# Patient Record
Sex: Female | Born: 1935
Health system: Southern US, Community
[De-identification: ages and names within clinical notes are randomized; demographics above are authoritative.]

## PROBLEM LIST (undated history)

## (undated) DIAGNOSIS — M810 Age-related osteoporosis without current pathological fracture: Secondary | ICD-10-CM

## (undated) DIAGNOSIS — R7303 Prediabetes: Secondary | ICD-10-CM

## (undated) DIAGNOSIS — H269 Unspecified cataract: Secondary | ICD-10-CM

## (undated) DIAGNOSIS — L815 Leukoderma, not elsewhere classified: Secondary | ICD-10-CM

## (undated) DIAGNOSIS — M75100 Unspecified rotator cuff tear or rupture of unspecified shoulder, not specified as traumatic: Secondary | ICD-10-CM

## (undated) DIAGNOSIS — B351 Tinea unguium: Secondary | ICD-10-CM

## (undated) DIAGNOSIS — F4321 Adjustment disorder with depressed mood: Secondary | ICD-10-CM

## (undated) DIAGNOSIS — H35039 Hypertensive retinopathy, unspecified eye: Secondary | ICD-10-CM

## (undated) DIAGNOSIS — K219 Gastro-esophageal reflux disease without esophagitis: Secondary | ICD-10-CM

## (undated) DIAGNOSIS — I10 Essential (primary) hypertension: Secondary | ICD-10-CM

## (undated) DIAGNOSIS — H3530 Unspecified macular degeneration: Secondary | ICD-10-CM

## (undated) DIAGNOSIS — N838 Other noninflammatory disorders of ovary, fallopian tube and broad ligament: Secondary | ICD-10-CM

## (undated) DIAGNOSIS — D569 Thalassemia, unspecified: Secondary | ICD-10-CM

## (undated) DIAGNOSIS — F4320 Adjustment disorder, unspecified: Secondary | ICD-10-CM

## (undated) HISTORY — DX: Unspecified cataract: H26.9

## (undated) HISTORY — PX: EYE SURGERY: SHX253

## (undated) HISTORY — DX: Gastro-esophageal reflux disease without esophagitis: K21.9

## (undated) HISTORY — DX: Leukoderma, not elsewhere classified: L81.5

## (undated) HISTORY — DX: Age-related osteoporosis without current pathological fracture: M81.0

## (undated) HISTORY — DX: Adjustment disorder, unspecified: F43.20

## (undated) HISTORY — DX: Unspecified rotator cuff tear or rupture of unspecified shoulder, not specified as traumatic: M75.100

## (undated) HISTORY — DX: Hypertensive retinopathy, unspecified eye: H35.039

## (undated) HISTORY — DX: Tinea unguium: B35.1

## (undated) HISTORY — PX: CATARACT EXTRACTION: SUR2

## (undated) HISTORY — DX: Adjustment disorder with depressed mood: F43.21

## (undated) HISTORY — DX: Unspecified macular degeneration: H35.30

## (undated) HISTORY — DX: Other noninflammatory disorders of ovary, fallopian tube and broad ligament: N83.8

## (undated) HISTORY — DX: Prediabetes: R73.03

## (undated) HISTORY — DX: Essential (primary) hypertension: I10

## (undated) HISTORY — DX: Thalassemia, unspecified: D56.9

---

## 2014-10-10 LAB — HEMOGLOBIN A1C: Hemoglobin A1C: 5.9

## 2014-10-10 LAB — TSH: TSH: 0.83 (ref 0.41–5.90)

## 2014-12-13 LAB — CBC AND DIFFERENTIAL
HCT: 36 (ref 36–46)
Hemoglobin: 11.3 — AB (ref 12.0–16.0)
Platelets: 251 (ref 150–399)
WBC: 7.2

## 2014-12-13 LAB — BASIC METABOLIC PANEL
BUN: 15 (ref 4–21)
Creatinine: 0.8 (ref 0.5–1.1)

## 2014-12-15 LAB — BASIC METABOLIC PANEL
CO2: 25 — AB (ref 13–22)
Chloride: 103 (ref 99–108)
Potassium: 4.5 (ref 3.4–5.3)
Sodium: 137 (ref 137–147)

## 2014-12-20 HISTORY — PX: OTHER SURGICAL HISTORY: SHX169

## 2018-02-16 LAB — BASIC METABOLIC PANEL
BUN: 18 (ref 4–21)
CO2: 20 (ref 13–22)
Chloride: 106 (ref 99–108)
Creatinine: 0.8 (ref 0.5–1.1)
Glucose: 138
Potassium: 3.7 (ref 3.4–5.3)
Sodium: 139 (ref 137–147)

## 2018-02-16 LAB — CBC AND DIFFERENTIAL
HCT: 36 (ref 36–46)
Hemoglobin: 11.3 — AB (ref 12.0–16.0)
Neutrophils Absolute: 94
Platelets: 261 (ref 150–399)
WBC: 13.1

## 2019-11-04 HISTORY — PX: CATARACT EXTRACTION, BILATERAL: SHX1313

## 2019-12-09 LAB — BASIC METABOLIC PANEL
Creatinine: 0.7 (ref 0.5–1.1)
Potassium: 3.8 (ref 3.4–5.3)
Sodium: 143 (ref 137–147)

## 2019-12-09 LAB — CBC AND DIFFERENTIAL
HCT: 34 — AB (ref 36–46)
Hemoglobin: 10.1 — AB (ref 12.0–16.0)
Platelets: 258 (ref 150–399)
WBC: 6.2

## 2019-12-09 LAB — LIPID PANEL
Cholesterol: 157 (ref 0–200)
HDL: 60 (ref 35–70)
LDL Cholesterol: 85

## 2019-12-09 LAB — VITAMIN D 25 HYDROXY (VIT D DEFICIENCY, FRACTURES): Vit D, 25-Hydroxy: 53

## 2019-12-09 LAB — TSH: TSH: 0.82 (ref 0.41–5.90)

## 2019-12-09 LAB — HEPATIC FUNCTION PANEL: ALT: 25 (ref 7–35)

## 2019-12-09 LAB — CBC: RBC: 5.15 — AB (ref 3.87–5.11)

## 2019-12-29 ENCOUNTER — Ambulatory Visit: Payer: Self-pay | Attending: Internal Medicine

## 2019-12-29 ENCOUNTER — Ambulatory Visit: Payer: Self-pay

## 2019-12-29 DIAGNOSIS — Z23 Encounter for immunization: Secondary | ICD-10-CM | POA: Insufficient documentation

## 2019-12-29 NOTE — Progress Notes (Signed)
   Covid-19 Vaccination Clinic  Name:  Elona Grzeskowiak    MRN: [id number] DOB: 1934/12/24  12/29/2019  Ms. Pattan was observed post Covid-19 immunization for 15 minutes without incidence. She was provided with Vaccine Information Sheet and instruction to access the V-Safe system.   Ms. Rustin was instructed to call 911 with any severe reactions post vaccine: Marland Kitchen Difficulty breathing  . Swelling of your face and throat  . A fast heartbeat  . A bad rash all over your body  . Dizziness and weakness    Immunizations Administered    Name Date Dose VIS Date Route   Pfizer COVID-19 Vaccine 12/29/2019 10:54 AM 0.3 mL 10/14/2019 Intramuscular   Manufacturer: [place]   Lot: [id number]   [place]: [id number]

## 2020-01-12 ENCOUNTER — Ambulatory Visit: Payer: Self-pay | Admitting: Internal Medicine

## 2020-01-18 DIAGNOSIS — R7303 Prediabetes: Secondary | ICD-10-CM

## 2020-01-18 DIAGNOSIS — E1169 Type 2 diabetes mellitus with other specified complication: Secondary | ICD-10-CM

## 2020-01-18 DIAGNOSIS — L989 Disorder of the skin and subcutaneous tissue, unspecified: Secondary | ICD-10-CM

## 2020-01-18 DIAGNOSIS — D569 Thalassemia, unspecified: Secondary | ICD-10-CM

## 2020-01-18 DIAGNOSIS — E1159 Type 2 diabetes mellitus with other circulatory complications: Secondary | ICD-10-CM

## 2020-01-18 DIAGNOSIS — M810 Age-related osteoporosis without current pathological fracture: Secondary | ICD-10-CM

## 2020-01-18 DIAGNOSIS — E785 Hyperlipidemia, unspecified: Secondary | ICD-10-CM

## 2020-01-18 DIAGNOSIS — F4321 Adjustment disorder with depressed mood: Secondary | ICD-10-CM

## 2020-01-18 DIAGNOSIS — L609 Nail disorder, unspecified: Secondary | ICD-10-CM

## 2020-01-31 DIAGNOSIS — Z139 Encounter for screening, unspecified: Secondary | ICD-10-CM | POA: Insufficient documentation

## 2020-01-31 DIAGNOSIS — R7303 Prediabetes: Secondary | ICD-10-CM | POA: Insufficient documentation

## 2020-01-31 DIAGNOSIS — L609 Nail disorder, unspecified: Secondary | ICD-10-CM | POA: Insufficient documentation

## 2020-01-31 DIAGNOSIS — E1169 Type 2 diabetes mellitus with other specified complication: Secondary | ICD-10-CM | POA: Insufficient documentation

## 2020-01-31 DIAGNOSIS — D569 Thalassemia, unspecified: Secondary | ICD-10-CM | POA: Insufficient documentation

## 2020-01-31 DIAGNOSIS — E785 Hyperlipidemia, unspecified: Secondary | ICD-10-CM | POA: Insufficient documentation

## 2020-01-31 DIAGNOSIS — L989 Disorder of the skin and subcutaneous tissue, unspecified: Secondary | ICD-10-CM | POA: Insufficient documentation

## 2020-01-31 DIAGNOSIS — K219 Gastro-esophageal reflux disease without esophagitis: Secondary | ICD-10-CM | POA: Insufficient documentation

## 2020-01-31 DIAGNOSIS — E1159 Type 2 diabetes mellitus with other circulatory complications: Secondary | ICD-10-CM | POA: Insufficient documentation

## 2020-01-31 DIAGNOSIS — F4321 Adjustment disorder with depressed mood: Secondary | ICD-10-CM | POA: Insufficient documentation

## 2020-01-31 DIAGNOSIS — M810 Age-related osteoporosis without current pathological fracture: Secondary | ICD-10-CM | POA: Insufficient documentation

## 2020-02-06 DIAGNOSIS — H2513 Age-related nuclear cataract, bilateral: Secondary | ICD-10-CM | POA: Diagnosis not present

## 2020-02-06 DIAGNOSIS — H524 Presbyopia: Secondary | ICD-10-CM | POA: Diagnosis not present

## 2020-02-06 DIAGNOSIS — H353131 Nonexudative age-related macular degeneration, bilateral, early dry stage: Secondary | ICD-10-CM | POA: Diagnosis not present

## 2020-02-06 DIAGNOSIS — D3132 Benign neoplasm of left choroid: Secondary | ICD-10-CM | POA: Diagnosis not present

## 2020-02-27 ENCOUNTER — Encounter: Payer: Self-pay | Admitting: Internal Medicine

## 2020-02-27 ENCOUNTER — Ambulatory Visit: Payer: Self-pay | Admitting: Internal Medicine

## 2020-03-05 ENCOUNTER — Ambulatory Visit: Payer: Self-pay | Admitting: Internal Medicine

## 2020-03-06 MED FILL — MOXIFLOXACIN HCL 0.5 % SOLN: 0.5 | 30 days supply | Qty: 3 | Fill #0

## 2020-03-08 DIAGNOSIS — H2512 Age-related nuclear cataract, left eye: Secondary | ICD-10-CM | POA: Diagnosis not present

## 2020-03-19 ENCOUNTER — Other Ambulatory Visit: Payer: Self-pay | Admitting: Internal Medicine

## 2020-03-19 ENCOUNTER — Encounter: Payer: Self-pay | Admitting: Internal Medicine

## 2020-03-19 ENCOUNTER — Ambulatory Visit (INDEPENDENT_AMBULATORY_CARE_PROVIDER_SITE_OTHER): Payer: PPO | Admitting: Internal Medicine

## 2020-03-19 ENCOUNTER — Other Ambulatory Visit: Payer: Self-pay

## 2020-03-19 VITALS — BP 122/70 | HR 64 | Temp 97.3°F | Ht 63.0 in | Wt 110.6 lb

## 2020-03-19 DIAGNOSIS — G4762 Sleep related leg cramps: Secondary | ICD-10-CM

## 2020-03-19 DIAGNOSIS — F4321 Adjustment disorder with depressed mood: Secondary | ICD-10-CM

## 2020-03-19 DIAGNOSIS — I10 Essential (primary) hypertension: Secondary | ICD-10-CM | POA: Diagnosis not present

## 2020-03-19 DIAGNOSIS — H9312 Tinnitus, left ear: Secondary | ICD-10-CM | POA: Diagnosis not present

## 2020-03-19 DIAGNOSIS — L659 Nonscarring hair loss, unspecified: Secondary | ICD-10-CM

## 2020-03-19 DIAGNOSIS — D563 Thalassemia minor: Secondary | ICD-10-CM | POA: Diagnosis not present

## 2020-03-19 DIAGNOSIS — F321 Major depressive disorder, single episode, moderate: Secondary | ICD-10-CM | POA: Diagnosis not present

## 2020-03-19 DIAGNOSIS — M810 Age-related osteoporosis without current pathological fracture: Secondary | ICD-10-CM

## 2020-03-19 DIAGNOSIS — R7303 Prediabetes: Secondary | ICD-10-CM | POA: Diagnosis not present

## 2020-03-19 MED ORDER — AMLODIPINE BESYLATE 5 MG PO TABS: 5.0000 mg | ORAL_TABLET | Freq: Every day | ORAL | 3 refills | Status: DC

## 2020-03-19 MED ORDER — MIRTAZAPINE 7.5 MG PO TABS: 7.5000 mg | ORAL_TABLET | Freq: Every day | ORAL | 3 refills | Status: DC

## 2020-03-19 MED FILL — AMLODIPINE BESYLATE 5 MG TA: 5 | 90 days supply | Qty: 90 | Fill #0

## 2020-03-19 MED FILL — MIRTAZAPINE 7.5 MG TABLET: 7.5 | 30 days supply | Qty: 30 | Fill #0

## 2020-03-19 NOTE — Progress Notes (Signed)
Provider:  Rexene Edison. Mariea Clonts, D.O., C.M.D. Location:   [place]   Place of Service:   clinic  Previous PCP: Gayland Curry, DO Patient Care Team: Gayland Curry, DO as PCP - General (Geriatric Medicine)  Extended Emergency Contact Information Primary Emergency Contact: [place], [place] Home Phone: [phone] Relation: Daughter  Code Status: will discuss at next visit (has living will and hcpoa, but need copies for vynca)  Goals of Care: Advanced Directive information Advanced Directives 03/19/2020  Does Patient Have a Medical Advance Directive? Yes  Type of Paramedic of [place];Living will  Does patient want to make changes to medical advance directive? No - Patient declined  Copy of [place] in Chart? No - copy requested      Chief Complaint  Patient presents with  . Establish Care    New patient to establish care     HPI: Patient is a 85 y.o. female seen today to establish with [facility].  Records have been received from [facility].  She is here with her daughter, Dr. Carlyle Basques, [place] specialist.  Pt has a 9th grade education equivalent and [place] is her first language.  Caren Griffins helped with any challenging communications.  Pt does not manage her own finances.  Left eye cataract lens replacement--left blurry--may be her astigmatism.  This was her primary complaint.  Has ophtho f/u planned.  She also has mild macular degeneration.  Her husband passed away about a year ago.  They were thinking about her surgery for a long time for the cataracts, but it was postponed b/c of his death and covid pandemic.   She has been sad.  52 years and 2 days they were married--she becomes tearful.  Appetite is good now.  She has been eating healthy portions, but has not gained weight since she left [place].  She previously played tennis until 1.5 years ago. She has stalled at 110 lbs.  She does not eat much meat.  Has eggs daily.  She does  eat cottage cheese and fruit.    Not exercising anymore--wants to get back to gardening which was encouraged along with walking.  Goes up and downstairs a lot.    Uses lexapro intermittently but not consistently as prescribed.  Had just begun in feb.  Still using first 30 pills per Caren Griffins.    She wakes up often.  Sometimes worries.  It started since dad had gotten ill.  She did try melatonin but it did not help.  Had a Rx for a sleeping pill but they are not sure the name.  She also communicates with friends in [place] and [place] and she talks to them in the middle of the night so that affects her sleep cycle.    2-4x per week, she gets night cramps in her legs also.  Is hydrating well.    Will drink 1/2 cup warm water when she wakes up, then goes to bathroom.  Just goes b/c she's awake--urinary frequency is not making her get up.  BP is well controlled.   Hair loss for about past two months.  Last TSH was feb and 0.82 wnl.    Uvula used to hang low and had uvula removed.  Had caused choking sensation.  No more difficulty swallowing since.    She's had prediabetes--hba1c in feb was 5.4.  Highest was 5.9 in 2014--not diabetic ever that we can find so modifiers adjusted accordingly in health maintenance.    Osteoporosis:  Takes calcium and vitamin D.  Took alendronate and stopped 3-4 years ago.  Was told to discontinue after 6 yrs.  We'll need to relook at bone density.     Thalassemia minor.  Her brother and sister also have as does her daughter.  Hgb runs around 10.  MCV 65.    Caren Griffins notices she has difficulty hearing.  Says people mumble.  Hears a buzzing in left ear.    Past Medical History:  Diagnosis Date  . Cataracts   . GERD (gastroesophageal reflux disease)   . Grief reaction   . Guttate hypomelanosis   . Hypertension   . Onychomycosis   . Osteoporosis   . Ovarian mass   . Prediabetes   . Rotator cuff syndrome   . Thalassanemia    Past Surgical History:  Procedure Laterality  Date  . UVULA MASS EXCISION  12/20/2014   [facility]     Social History   Socioeconomic History  . Marital status: Widowed    Spouse name: Not on file  . Number of children: Not on file  . Years of education: Not on file  . Highest education level: Not on file  Occupational History  . Not on file  Tobacco Use  . Smoking status: Never Smoker  . Smokeless tobacco: Never Used  Substance and Sexual Activity  . Alcohol use: Never  . Drug use: Never  . Sexual activity: Not Currently  Other Topics Concern  . Not on file  Social History Narrative   Diet: Chicken, Vegetable, Fish      Do you drink/ eat things with caffeine? No      Marital status:   Widow                             What year were you married ? 52 years as of 2020      Do you live in a house, apartment,assistred living, condo, trailer, etc.)? House      Is it one or more stories?  2      How many persons live in your home ?  1 myself      Do you have any pets in your home ?(please list) No      Highest Level of education completed: High School (9th grade) , [place] is first language       Current or past profession: Home Maker      Do you exercise?      No                        Type & how often       ADVANCED DIRECTIVES (Please bring copies)      Do you have a living will?       Do you have a DNR form?                       If not, do you want to discuss one?       Do you have signed POA?HPOA forms?                 If so, please bring to your appointment      FUNCTIONAL STATUS- To be completed by Spouse / child / Staff       Do you have difficulty bathing or dressing yourself ?  No      Do  you have difficulty preparing food or eating ?   No      Do you have difficulty managing your mediation ? No      Do you have difficulty managing your finances ?  Yes      Do you have difficulty affording your medication ?  No      Social Determinants of Health   Financial Resource Strain:   .  Difficulty of Paying Living Expenses:   Food Insecurity:   . Worried About Charity fundraiser in the Last Year:   . Arboriculturist in the Last Year:   Transportation Needs:   . Film/video editor (Medical):   Marland Kitchen Lack of Transportation (Non-Medical):   Physical Activity:   . Days of Exercise per Week:   . Minutes of Exercise per Session:   Stress:   . Feeling of Stress :   Social Connections:   . Frequency of Communication with Friends and Family:   . Frequency of Social Gatherings with Friends and Family:   . Attends Religious Services:   . Active Member of Clubs or Organizations:   . Attends Archivist Meetings:   Marland Kitchen Marital Status:     reports that she has never smoked. She has never used smokeless tobacco. She reports that she does not drink alcohol or use drugs.  Functional Status Survey:  independent   Family History  Problem Relation Age of Onset  . Liver disease Father 43    Health Maintenance  Topic Date Due  . FOOT EXAM  Never done  . OPHTHALMOLOGY EXAM  Never done  . URINE MICROALBUMIN  Never done  . DEXA SCAN  Never done  . HEMOGLOBIN A1C  04/11/2015  . TETANUS/TDAP  11/09/2019  . INFLUENZA VACCINE  06/03/2020  . COVID-19 Vaccine  Completed  . PNA vac Low Risk Adult  Completed    Allergies  Allergen Reactions  . Lisinopril-Hydrochlorothiazide     Outpatient Encounter Medications as of 03/19/2020  Medication Sig  . amLODipine (NORVASC) 5 MG tablet Take 5 mg by mouth daily.  Marland Kitchen CALCIUM PO Take by mouth.  . ciclopirox (PENLAC) 8 % solution Apply topically at bedtime. Apply over nail and surrounding skin. Apply daily over previous coat. After seven (7) days, may remove with alcohol and continue cycle.  . escitalopram (LEXAPRO) 5 MG tablet Take 5 mg by mouth daily.  . Glucosamine-Chondroit-Calcium (TRIPLE FLEX BONE & JOINT PO) Take 50 mg by mouth.  Marland Kitchen MAGNESIUM PO Take by mouth.  Marland Kitchen VITAMIN D PO Take by mouth.   No facility-administered  encounter medications on file as of 03/19/2020.    Review of Systems  Constitutional: Positive for weight loss. Negative for chills and fever.  HENT: Positive for hearing loss and tinnitus. Negative for congestion and sore throat.   Eyes: Positive for blurred vision.       Just had cataract surgery and has mild macular degeneration  Respiratory: Negative for cough and shortness of breath.   Cardiovascular: Negative for chest pain, palpitations and leg swelling.  Gastrointestinal: Negative for abdominal pain, blood in stool, constipation, diarrhea and melena.  Genitourinary: Negative for dysuria.  Musculoskeletal: Negative for falls and joint pain.  Skin: Negative for itching and rash.       Nail abnormality, thinning hair  Neurological: Negative for dizziness, loss of consciousness, weakness and headaches.  Endo/Heme/Allergies: Does not bruise/bleed easily.  Psychiatric/Behavioral: Positive for depression. Negative for hallucinations, memory loss, substance abuse  and suicidal ideas. The patient has insomnia. The patient is not nervous/anxious.        Prolonged grief    Vitals:   03/19/20 1010  BP: 122/70  Pulse: 64  Temp: (!) 97.3 F (36.3 C)  TempSrc: Temporal  SpO2: 99%  Weight: 110 lb 9.6 oz (50.2 kg)  Height: 5\' 3"  (1.6 m)   Body mass index is 19.59 kg/m. Physical Exam Vitals reviewed.  Constitutional:      General: She is not in acute distress.    Appearance: Normal appearance. She is not ill-appearing or toxic-appearing.     Comments: Thin female  HENT:     Head: Normocephalic and atraumatic.     Right Ear: Tympanic membrane, ear canal and external ear normal. There is no impacted cerumen.     Left Ear: Tympanic membrane, ear canal and external ear normal. There is no impacted cerumen.     Nose:     Comments: Deferred nose and mouth due to covid masking and no concerns in that area Eyes:     Extraocular Movements: Extraocular movements intact.      Conjunctiva/sclera: Conjunctivae normal.     Pupils: Pupils are equal, round, and reactive to light.  Cardiovascular:     Rate and Rhythm: Normal rate and regular rhythm.     Pulses: Normal pulses.     Heart sounds: Normal heart sounds. No murmur.  Pulmonary:     Effort: Pulmonary effort is normal.     Breath sounds: Normal breath sounds. No wheezing, rhonchi or rales.  Abdominal:     General: Bowel sounds are normal. There is no distension.     Palpations: Abdomen is soft.     Tenderness: There is no abdominal tenderness. There is no guarding or rebound.  Musculoskeletal:        General: Normal range of motion.     Cervical back: Neck supple. No tenderness.     Right lower leg: No edema.     Left lower leg: No edema.  Lymphadenopathy:     Cervical: No cervical adenopathy.  Skin:    General: Skin is warm and dry.     Capillary Refill: Capillary refill takes less than 2 seconds.  Neurological:     General: No focal deficit present.     Mental Status: She is alert and oriented to person, place, and time.     Cranial Nerves: No cranial nerve deficit.     Sensory: No sensory deficit.     Motor: No weakness.     Coordination: Coordination normal.     Gait: Gait normal.     Deep Tendon Reflexes: Reflexes normal.  Psychiatric:        Behavior: Behavior normal.        Thought Content: Thought content normal.        Judgment: Judgment normal.     Comments: Tearful when husband's death comes up     Labs reviewed: Basic Metabolic Panel: Recent Labs    12/09/19 0000  NA 143  K 3.8  CREATININE 0.7   Liver Function Tests: Recent Labs    12/09/19 0000  ALT 25   No results for input(s): LIPASE, AMYLASE in the last 8760 hours. No results for input(s): AMMONIA in the last 8760 hours. CBC: Recent Labs    12/09/19 0000  WBC 6.2  HGB 10.1*  HCT 34*  PLT 258   Cardiac Enzymes: No results for input(s): CKTOTAL, CKMB, CKMBINDEX, TROPONINI in the last  8760  hours. BNP: Invalid input(s): POCBNP Lab Results  Component Value Date   HGBA1C 5.9 10/10/2014   Lab Results  Component Value Date   TSH 0.82 12/09/2019   No results found for: VITAMINB12 No results found for: FOLATE No results found for: IRON, TIBC, FERRITIN  Imaging and Procedures noted on new patient packet: None  Assessment/Plan 1. Essential hypertension -bp is at goal and pt w/o edema as side effect at low dose, cont to monitor - amLODipine (NORVASC) 5 MG tablet; Take 1 tablet (5 mg total) by mouth daily.  Dispense: 90 tablet; Refill: 3  2. Grief reaction - we opted to add remeron to her regimen in hopes she will eat more protein and gain a little weight back so she has reserves if she were to get sick or injured, this should also help sleep and mood -encouraged resuming exercise, as well - mirtazapine (REMERON) 7.5 MG tablet; Take 1 tablet (7.5 mg total) by mouth at bedtime.  Dispense: 30 tablet; Refill: 3  3. Depression, major, single episode, moderate ([place]) - seems she may have more prolonged grief than expected at this point so will tx with regularly nightly medication - mirtazapine (REMERON) 7.5 MG tablet; Take 1 tablet (7.5 mg total) by mouth at bedtime.  Dispense: 30 tablet; Refill: 3  4. Age-related osteoporosis without current pathological fracture - due for recheck - previously took alendronate for at least 6 yrs - depending on T score, might consider prolia or evenity for her - DG Bone Density; Future  5. Thalassemia minor -inherited, has been stable, monitor - CBC with Differential/Platelet; Future - COMPLETE METABOLIC PANEL WITH GFR; Future  6. Prediabetes - has not been in diabetic range--sounds like she eats a healthy diet, just needs more protein now at advancing age and must get back to an exercise routine  -f/u labs before next appt - Lipid panel; Future - Hemoglobin A1c; Future  7. Hair loss - suspect stress and age-related -last TSH was  normal, but that was prior to this complaint began - TSH; Future  8. Left-sided tinnitus - and some hearing loss noted by family - referred for hearing eval--ears appear clear of wax and no visible pathology with otoscope exam - Ambulatory referral to Audiology  9. Nocturnal leg cramps -encouraged hydration, explained that some of these can be normal, could try a tablespoon of mustard, avoid stretching the legs in a way that triggers these during the night if possible  Labs/tests ordered:   Lab Orders     CBC with Differential/Platelet     Lipid panel     COMPLETE METABOLIC PANEL WITH GFR     Hemoglobin A1c     TSH  F/u 07/19/2020 for med mgt, fasting labs before  Discussed that Td booster can be done at pharmacy (had tdap last time) May do urine microalbumin at next visit.  Shanna Un L. Rainey Rodger, D.O. [facility] [address], [place] [address] Cell Phone (Mon-Fri 8am-5pm):  [phone] On Call:  [phone] & follow prompts after 5pm & weekends Office Phone:  [phone] Office Fax:  [phone]

## 2020-03-19 NOTE — Patient Instructions (Addendum)
It was a pleasure meeting you!  Try to get back to some regular exercise and increase the protein in your diet with more meat, milk products, beans.  Please bring a copy of the HCPOA and living will.

## 2020-04-13 ENCOUNTER — Ambulatory Visit: Payer: PPO | Attending: Internal Medicine | Admitting: Audiology

## 2020-04-13 ENCOUNTER — Other Ambulatory Visit: Payer: Self-pay

## 2020-04-13 DIAGNOSIS — H903 Sensorineural hearing loss, bilateral: Secondary | ICD-10-CM | POA: Diagnosis not present

## 2020-04-13 DIAGNOSIS — H9313 Tinnitus, bilateral: Secondary | ICD-10-CM | POA: Insufficient documentation

## 2020-04-13 NOTE — Procedures (Signed)
  Outpatient Audiology and [facility] [place], [place]  [address] [phone]  AUDIOLOGICAL  EVALUATION  NAME: Joanne Murray     DOB:   Mar 27, 1935      MRN: [id number]                                                                                     DATE: 04/13/2020     REFERENT: Gayland Curry, DO STATUS: Outpatient DIAGNOSIS: Sensorineural hearing loss, bilateral    History: Joanne Murray was seen for an audiological evaluation. Joanne Murray reports her daughter has concerns regarding her hearing sensitivity. Joanne Murray reports intermittent tinnitus occurring for 2 years. She denies otalgia, aural fullness, and dizziness. Joanne Murray denies communication difficulties.   Evaluation:   Otoscopy showed scant wax, bilaterally  Tympanometry results were consistent with normal middle ear function, bilaterally.   Audiometric testing was completed using Conventional Audiometry techniques with insert earphones and TDH headphones. Test results are consistent with a mild to moderately-severe sensorineural hearing loss in the left ear and a mild to severe sensorineural hearing loss in the right ear. Speech Recognition Thresholds were obtained at 30 dB HL in the right ear and at 35 dB HL in the left ear. Word Recognition Testing was completed at 70 dB HL in the right ear and Joanne Murray scored 88% and completed at 75 dB HL in the left ear and Joanne Murray scored 86%.    Results:  Today's test results are consistent with a mild to moderately-severe sensorineural hearing loss in the left ear and a mild to severe sensorineural hearing loss in the right ear. Asymmetry noted at 1500 Hz, worse in the left ear.  Joanne Murray will experience communication difficulty in many listening situations. She will benefit from the use of good communication strategies, the use of amplification, and the use of assistive listening devices. Joanne Murray was given handouts on effective communication  strategies. The test results were reviewed with Joanne Murray.   Recommendations: 1.   Communication Needs Assessment with an Audiologist to discuss amplification if patient is motivated.  2.   Referral to an Ear, Nose, and Throat Physician for asymmetry noted at 1500 Hz.     Bari Mantis Audiologist, Au.D., CCC-A 04/13/2020  9:16 AM  Cc: Gayland Curry, DO

## 2020-05-01 MED FILL — MOXIFLOXACIN HCL 0.5 % SOLN: 0.5 | 30 days supply | Qty: 3 | Fill #1

## 2020-05-01 MED FILL — PREDNISOLONE AC 1% EYE DROP: 1 | 25 days supply | Qty: 5 | Fill #0

## 2020-05-03 DIAGNOSIS — H2511 Age-related nuclear cataract, right eye: Secondary | ICD-10-CM | POA: Diagnosis not present

## 2020-05-03 DIAGNOSIS — H25811 Combined forms of age-related cataract, right eye: Secondary | ICD-10-CM | POA: Diagnosis not present

## 2020-06-07 DIAGNOSIS — H353131 Nonexudative age-related macular degeneration, bilateral, early dry stage: Secondary | ICD-10-CM | POA: Diagnosis not present

## 2020-07-04 DIAGNOSIS — Z961 Presence of intraocular lens: Secondary | ICD-10-CM | POA: Diagnosis not present

## 2020-07-19 ENCOUNTER — Other Ambulatory Visit: Payer: Self-pay

## 2020-07-19 ENCOUNTER — Encounter: Payer: Self-pay | Admitting: Internal Medicine

## 2020-07-19 ENCOUNTER — Ambulatory Visit (INDEPENDENT_AMBULATORY_CARE_PROVIDER_SITE_OTHER): Payer: PPO | Admitting: Internal Medicine

## 2020-07-19 VITALS — BP 118/64 | HR 54 | Resp 16 | Ht 63.0 in | Wt 113.8 lb

## 2020-07-19 DIAGNOSIS — F321 Major depressive disorder, single episode, moderate: Secondary | ICD-10-CM | POA: Diagnosis not present

## 2020-07-19 DIAGNOSIS — L659 Nonscarring hair loss, unspecified: Secondary | ICD-10-CM | POA: Diagnosis not present

## 2020-07-19 DIAGNOSIS — D563 Thalassemia minor: Secondary | ICD-10-CM | POA: Diagnosis not present

## 2020-07-19 DIAGNOSIS — D229 Melanocytic nevi, unspecified: Secondary | ICD-10-CM | POA: Diagnosis not present

## 2020-07-19 DIAGNOSIS — R7303 Prediabetes: Secondary | ICD-10-CM | POA: Diagnosis not present

## 2020-07-19 DIAGNOSIS — M810 Age-related osteoporosis without current pathological fracture: Secondary | ICD-10-CM

## 2020-07-19 DIAGNOSIS — L821 Other seborrheic keratosis: Secondary | ICD-10-CM

## 2020-07-19 DIAGNOSIS — I10 Essential (primary) hypertension: Secondary | ICD-10-CM | POA: Diagnosis not present

## 2020-07-19 DIAGNOSIS — Z23 Encounter for immunization: Secondary | ICD-10-CM

## 2020-07-19 NOTE — Progress Notes (Signed)
Location:  [facility] clinic Provider:  Lyda Colcord L. Mariea Clonts, D.O., C.M.D.  Goals of Care:  Advanced Directives 07/19/2020  Does Patient Have a Medical Advance Directive? Yes  Type of Advance Directive Out of facility DNR (pink MOST or yellow form);Living will;Healthcare Power of Attorney  Does patient want to make changes to medical advance directive? No - Patient declined  Copy of [place] in Chart? No - copy requested     Chief Complaint  Patient presents with  . Medical Management of Chronic Issues    4 Month follow up.  Marland Kitchen Health Maintenance    Discuss the need for Dexa Scan, and Urine Microalbumin.   . Immunizations    Discuss the need for Tetanus Vaccine, and Influenza Vaccine.    HPI: Patient is a 85 y.o. female seen today for medical management of chronic diseases.    She accepted her flu vaccine today which was given.   She does not want a mammogram.  We are rechecking records about the bone density.  She wants me to check on her skin of her right upper arm and moles she has.    She admits she is sometimes nervous.  Thinks her mood is better.  She's been invited to a few church groups.  Has a good friend she eats meals with and socializes.  She is avoiding church group setting.    She is starting some walking.  Twice a week, she's doing 2 miles.  Not playing tennis like she did.  She needs walking buddies.    Says appetite is good.  Sometimes eats too much.  Weight is up about 3 lbs.  Had her bilateral cataracts done.  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  Past Medical History:  Diagnosis Date  . Cataracts   . GERD (gastroesophageal reflux disease)   . Grief reaction   . Guttate hypomelanosis   . Hypertension   . Onychomycosis   . Osteoporosis   . Ovarian mass   . Prediabetes   . Rotator cuff syndrome   . Thalassanemia     Past Surgical History:  Procedure Laterality Date  . UVULA MASS  EXCISION  12/20/2014   [facility]     Allergies  Allergen Reactions  . Lisinopril-Hydrochlorothiazide     Outpatient Encounter Medications as of 07/19/2020  Medication Sig  . amLODipine (NORVASC) 5 MG tablet Take 1 tablet (5 mg total) by mouth daily.  Marland Kitchen CALCIUM PO Take by mouth.  . ciclopirox (PENLAC) 8 % solution Apply topically at bedtime. Apply over nail and surrounding skin. Apply daily over previous coat. After seven (7) days, may remove with alcohol and continue cycle.  . Glucosamine-Chondroit-Calcium (TRIPLE FLEX BONE & JOINT PO) Take 50 mg by mouth.  Marland Kitchen MAGNESIUM PO Take by mouth.  Marland Kitchen VITAMIN D PO Take by mouth.  . [DISCONTINUED] mirtazapine (REMERON) 7.5 MG tablet Take 1 tablet (7.5 mg total) by mouth at bedtime.   No facility-administered encounter medications on file as of 07/19/2020.    Review of Systems:  Review of Systems  Constitutional: Negative for chills, fever, malaise/fatigue and weight loss.  HENT: Negative for congestion and sore throat.   Eyes: Positive for blurred vision.       Left eye not clear due to pterygium  Respiratory: Negative for cough and shortness of breath.   Cardiovascular: Negative for chest pain, palpitations and leg swelling.  Gastrointestinal: Negative for abdominal pain and  constipation.  Genitourinary: Negative for dysuria.  Musculoskeletal: Negative for falls, joint pain and myalgias.  Skin: Negative for rash.       Multiple age spots  Neurological: Negative for dizziness, loss of consciousness and weakness.  Endo/Heme/Allergies: Bruises/bleeds easily.  Psychiatric/Behavioral: Positive for depression. Negative for memory loss. The patient is not nervous/anxious and does not have insomnia.        Mood improved    Health Maintenance  Topic Date Due  . URINE MICROALBUMIN  Never done  . DEXA SCAN  Never done  . TETANUS/TDAP  11/09/2019  . INFLUENZA VACCINE  06/03/2020  . COVID-19 Vaccine  Completed  . PNA vac Low Risk Adult   Completed    Physical Exam: Vitals:   07/19/20 0921  BP: 118/64  Pulse: (!) 54  Resp: 16  SpO2: 98%  Weight: 113 lb 12.8 oz (51.6 kg)  Height: 5\' 3"  (1.6 m)   Body mass index is 20.16 kg/m. Physical Exam Vitals reviewed.  Constitutional:      General: She is not in acute distress.    Appearance: Normal appearance. She is not toxic-appearing.  HENT:     Head: Normocephalic and atraumatic.     Right Ear: External ear normal.     Left Ear: External ear normal.  Eyes:     Conjunctiva/sclera: Conjunctivae normal.     Pupils: Pupils are equal, round, and reactive to light.     Comments: Left pterygium  Cardiovascular:     Rate and Rhythm: Normal rate and regular rhythm.     Pulses: Normal pulses.     Heart sounds: Normal heart sounds.  Pulmonary:     Effort: Pulmonary effort is normal.     Breath sounds: Normal breath sounds. No wheezing, rhonchi or rales.  Musculoskeletal:        General: Normal range of motion.     Cervical back: Neck supple.     Right lower leg: No edema.     Left lower leg: No edema.  Lymphadenopathy:     Cervical: No cervical adenopathy.  Skin:    General: Skin is warm and dry.     Comments: Right upper arm lipoma near axilla; left groin nevus, left lateral thigh two keratoses, keratoses of arm, raised nevus on chin  Neurological:     General: No focal deficit present.     Mental Status: She is alert and oriented to person, place, and time. Mental status is at baseline.     Motor: No weakness.     Gait: Gait normal.  Psychiatric:        Mood and Affect: Mood normal.        Behavior: Behavior normal.     Labs reviewed: Basic Metabolic Panel: Recent Labs    12/09/19 0000  NA 143  K 3.8  CREATININE 0.7  TSH 0.82   Liver Function Tests: Recent Labs    12/09/19 0000  ALT 25   No results for input(s): LIPASE, AMYLASE in the last 8760 hours. No results for input(s): AMMONIA in the last 8760 hours. CBC: Recent Labs    12/09/19 0000    WBC 6.2  HGB 10.1*  HCT 34*  PLT 258   Lipid Panel: Recent Labs    12/09/19 0000  CHOL 157  HDL 60  LDLCALC 85   Lab Results  Component Value Date   HGBA1C 5.9 10/10/2014    Procedures since last visit: No results found.  Assessment/Plan 1. Depression, major, single  episode, moderate (HCC) -seems to be improved, not taking remeron  2. Essential hypertension -bp well controlled with low dose amlodipine--cont same and monitor  3. Need for influenza vaccination - Flu Vaccine QUAD High Dose(Fluad) given  4. Thalassemia minor - f/u labs  - COMPLETE METABOLIC PANEL WITH GFR - CBC with Differential/Platelet  5. Prediabetes - encouraged exercise program, f/u labs - Hemoglobin A1c - Lipid panel  6. Seborrheic keratoses - she would like some of these removed with cryopen - Ambulatory referral to Dermatology  7. Atypical nevus - also has several of these, monitor, requests derm referral so placed, none appear worrisome to me - Ambulatory referral to Dermatology  8. Hair loss - f/u labs: - TSH -was historically in range in [place] labs  9. Age-related osteoporosis without current pathological fracture -reviewed records from [facility] and no bone density located so will need this done--continue vitamin D - DG Bone Density; Future  Labs/tests ordered:   Orders Placed This Encounter  Procedures  . DG Bone Density    Standing Status:   Future    Standing Expiration Date:   01/16/2021    Scheduling Instructions:     Please arrange with daughter, Caren Griffins    Order Specific Question:   Reason for Exam (SYMPTOM  OR DIAGNOSIS REQUIRED)    Answer:   osteoporosis    Order Specific Question:   Preferred imaging location?    Answer:   [facility]    Order Specific Question:   Release to patient    Answer:   Immediate  . Flu Vaccine QUAD High Dose(Fluad)  . Ambulatory referral to Dermatology    Referral Priority:   Routine    Referral Type:   Consultation     Referral Reason:   Specialty Services Required    Requested Specialty:   Dermatology    Number of Visits Requested:   1    Next appt:  6 mos med mgt   Elspeth Blucher L. Praneel Haisley, D.O. [facility] [address], [place] [address] Cell Phone (Mon-Fri 8am-5pm):  [phone] On Call:  [phone] & follow prompts after 5pm & weekends Office Phone:  [phone] Office Fax:  [phone]

## 2020-07-19 NOTE — Patient Instructions (Signed)
Please obtain your tdap vaccine at the pharmacy.

## 2020-07-20 LAB — COMPLETE METABOLIC PANEL WITH GFR
AG Ratio: 1.6 (calc) (ref 1.0–2.5)
ALT: 16 U/L (ref 6–29)
AST: 17 U/L (ref 10–35)
Albumin: 4 g/dL (ref 3.6–5.1)
Alkaline phosphatase (APISO): 57 U/L (ref 37–153)
BUN: 15 mg/dL (ref 7–25)
CO2: 26 mmol/L (ref 20–32)
Calcium: 9 mg/dL (ref 8.6–10.4)
Chloride: 106 mmol/L (ref 98–110)
Creat: 0.72 mg/dL (ref 0.60–0.88)
GFR, Est African American: 90 mL/min/{1.73_m2} (ref >=60)
GFR, Est Non African American: 77 mL/min/{1.73_m2} (ref >=60)
Globulin: 2.5 g/dL (calc) (ref 1.9–3.7)
Glucose, Bld: 89 mg/dL (ref 65–99)
Potassium: 4 mmol/L (ref 3.5–5.3)
Sodium: 141 mmol/L (ref 135–146)
Total Bilirubin: 0.9 mg/dL (ref 0.2–1.2)
Total Protein: 6.5 g/dL (ref 6.1–8.1)

## 2020-07-20 LAB — CBC WITH DIFFERENTIAL/PLATELET
Absolute Monocytes: 330 cells/uL (ref 200–950)
Basophils Absolute: 40 cells/uL (ref 0–200)
Basophils Relative: 0.6 %
Eosinophils Absolute: 92 cells/uL (ref 15–500)
Eosinophils Relative: 1.4 %
HCT: 39 % (ref 35.0–45.0)
Hemoglobin: 11.1 g/dL — ABNORMAL LOW (ref 11.7–15.5)
Lymphs Abs: 1610 cells/uL (ref 850–3900)
MCH: 19.6 pg — ABNORMAL LOW (ref 27.0–33.0)
MCHC: 28.5 g/dL — ABNORMAL LOW (ref 32.0–36.0)
MCV: 68.8 fL — ABNORMAL LOW (ref 80.0–100.0)
MPV: 10.9 fL (ref 7.5–12.5)
Monocytes Relative: 5 %
Neutro Abs: 4528 cells/uL (ref 1500–7800)
Neutrophils Relative %: 68.6 %
Platelets: 258 10*3/uL (ref 140–400)
RBC: 5.67 10*6/uL — ABNORMAL HIGH (ref 3.80–5.10)
RDW: 17.2 % — ABNORMAL HIGH (ref 11.0–15.0)
Total Lymphocyte: 24.4 %
WBC: 6.6 10*3/uL (ref 3.8–10.8)

## 2020-07-20 LAB — LIPID PANEL
Cholesterol: 173 mg/dL (ref <200)
HDL: 61 mg/dL (ref >=50)
LDL Cholesterol (Calc): 92 mg/dL (calc)
Non-HDL Cholesterol (Calc): 112 mg/dL (calc) (ref <130)
Total CHOL/HDL Ratio: 2.8 (calc) (ref <5.0)
Triglycerides: 100 mg/dL (ref <150)

## 2020-07-20 LAB — HEMOGLOBIN A1C
Hgb A1c MFr Bld: 5.7 % of total Hgb — ABNORMAL HIGH (ref <5.7)
Mean Plasma Glucose: 117 (calc)
eAG (mmol/L): 6.5 (calc)

## 2020-07-20 LAB — TSH: TSH: 0.81 mIU/L (ref 0.40–4.50)

## 2020-07-23 NOTE — Progress Notes (Signed)
Thyroid normal Sugar average improved from 5.9 to 5.7 (lower end of prediabetic range) Continue to increase walking program as able Electrolytes and kidneys are normal Cholesterol is good

## 2020-08-09 ENCOUNTER — Ambulatory Visit: Payer: PPO | Attending: Internal Medicine

## 2020-08-09 DIAGNOSIS — Z23 Encounter for immunization: Secondary | ICD-10-CM

## 2020-08-09 NOTE — Progress Notes (Signed)
   Covid-19 Vaccination Clinic  Name:  Oretta Berkland    MRN: [id number] DOB: December 06, 1934  08/09/2020  Ms. Sand was observed post Covid-19 immunization for 15 minutes without incident. She was provided with Vaccine Information Sheet and instruction to access the V-Safe system.   Ms. Persaud was instructed to call 911 with any severe reactions post vaccine: Marland Kitchen Difficulty breathing  . Swelling of face and throat  . A fast heartbeat  . A bad rash all over body  . Dizziness and weakness

## 2020-08-29 MED FILL — AMLODIPINE BESYLATE 5 MG TA: 5 | 90 days supply | Qty: 90 | Fill #1

## 2020-11-13 ENCOUNTER — Other Ambulatory Visit: Payer: PPO

## 2020-12-10 MED FILL — AMLODIPINE BESYLATE 5 MG TA: 5 | 90 days supply | Qty: 90 | Fill #2

## 2020-12-20 ENCOUNTER — Ambulatory Visit: Payer: PPO | Admitting: Physician Assistant

## 2020-12-24 ENCOUNTER — Encounter: Payer: Self-pay | Admitting: Internal Medicine

## 2021-01-07 ENCOUNTER — Other Ambulatory Visit (HOSPITAL_COMMUNITY): Payer: Self-pay | Admitting: Oral and Maxillofacial Surgery

## 2021-01-07 HISTORY — PX: TOOTH EXTRACTION: SUR596

## 2021-01-07 MED FILL — DEXAMETHASONE 4 MG TABLET: 4 | 3 days supply | Qty: 9 | Fill #0

## 2021-01-07 MED FILL — IBUPROFEN 400 MG TABS: 400 | 5 days supply | Qty: 30 | Fill #0

## 2021-01-07 MED FILL — AMOXICILLIN 500 MG CAPSULE: 500 | 7 days supply | Qty: 21 | Fill #0

## 2021-01-07 MED FILL — CHLORHEXIDINE 0.12% RINSE: 0.12 | 15 days supply | Qty: 473 | Fill #0

## 2021-01-17 ENCOUNTER — Other Ambulatory Visit: Payer: Self-pay

## 2021-01-17 ENCOUNTER — Other Ambulatory Visit: Payer: Self-pay | Admitting: Internal Medicine

## 2021-01-17 ENCOUNTER — Encounter: Payer: Self-pay | Admitting: Internal Medicine

## 2021-01-17 ENCOUNTER — Ambulatory Visit (INDEPENDENT_AMBULATORY_CARE_PROVIDER_SITE_OTHER): Payer: PPO | Admitting: Internal Medicine

## 2021-01-17 VITALS — BP 116/62 | HR 83 | Temp 97.5°F | Ht 63.0 in | Wt 114.0 lb

## 2021-01-17 DIAGNOSIS — I10 Essential (primary) hypertension: Secondary | ICD-10-CM | POA: Diagnosis not present

## 2021-01-17 DIAGNOSIS — D563 Thalassemia minor: Secondary | ICD-10-CM

## 2021-01-17 DIAGNOSIS — F321 Major depressive disorder, single episode, moderate: Secondary | ICD-10-CM | POA: Diagnosis not present

## 2021-01-17 DIAGNOSIS — M810 Age-related osteoporosis without current pathological fracture: Secondary | ICD-10-CM

## 2021-01-17 DIAGNOSIS — R7303 Prediabetes: Secondary | ICD-10-CM

## 2021-01-17 MED ORDER — BUPROPION HCL ER (XL) 150 MG PO TB24: 150.0000 mg | ORAL_TABLET | Freq: Every day | ORAL | 3 refills | Status: DC

## 2021-01-17 NOTE — Progress Notes (Signed)
Location:  [facility] clinic Provider:  Amman Bartel L. Mariea Clonts, D.O., C.M.D.  Goals of Care:  Advanced Directives 01/17/2021  Does Patient Have a Medical Advance Directive? Yes  Type of Paramedic of [place];Living will  Does patient want to make changes to medical advance directive? No - Patient declined  Copy of [place] in Chart? No - copy requested     Chief Complaint  Patient presents with  . Medical Management of Chronic Issues    6 month follow-up. Discuss need for MALB and TD/tdap. Here with daughter Caren Griffins    HPI: Patient is a 86 y.o. female seen today for medical management of chronic diseases.    Tooth pulled 3/7 and had to take abx for it.  Could not eat and sleep until yesterday and today.  Eating better,sleeping better and stomach not hurting anymore.  No diarrhea.  Going once instead of twice a day now.    Asks about her skin changes.  Her daughter is going to schedule bone density for her.    Blood pressure is great today.  She is nervous.  She is still grieving the loss of her husband.  She talks about wanting to die.  She did not like how remeron made her feel.    Past Medical History:  Diagnosis Date  . Cataracts   . GERD (gastroesophageal reflux disease)   . Grief reaction   . Guttate hypomelanosis   . Hypertension   . Onychomycosis   . Osteoporosis   . Ovarian mass   . Prediabetes   . Rotator cuff syndrome   . Thalassanemia     Past Surgical History:  Procedure Laterality Date  . CATARACT EXTRACTION, BILATERAL Bilateral 2021  . TOOTH EXTRACTION  01/07/2021   2 teeth pulled   . UVULA MASS EXCISION  12/20/2014   [facility]     Allergies  Allergen Reactions  . Lisinopril-Hydrochlorothiazide     Outpatient Encounter Medications as of 01/17/2021  Medication Sig  . amLODipine (NORVASC) 5 MG tablet Take 1 tablet (5 mg total) by mouth daily.  Marland Kitchen CALCIUM PO Take by mouth.  .  Glucosamine-Chondroit-Calcium (TRIPLE FLEX BONE & JOINT PO) Take 50 mg by mouth.  Marland Kitchen MAGNESIUM PO Take by mouth.  Marland Kitchen VITAMIN D PO Take 3,000 Units by mouth daily.  . [DISCONTINUED] ciclopirox (PENLAC) 8 % solution Apply topically at bedtime. Apply over nail and surrounding skin. Apply daily over previous coat. After seven (7) days, may remove with alcohol and continue cycle.   No facility-administered encounter medications on file as of 01/17/2021.    Review of Systems:  Review of Systems  Constitutional: Negative for chills and fever.  HENT:       Mouth pain improved, had a few teeth on lower jaw extracted--still swollen  Eyes: Negative for blurred vision.  Respiratory: Negative for cough and shortness of breath.   Cardiovascular: Negative for chest pain, palpitations and leg swelling.  Gastrointestinal: Negative for abdominal pain, blood in stool, constipation and melena.  Genitourinary: Negative for dysuria.  Musculoskeletal: Negative for falls and joint pain.  Skin:       Multiple solar keratoses  Neurological: Negative for dizziness and loss of consciousness.  Endo/Heme/Allergies: Bruises/bleeds easily.  Psychiatric/Behavioral: Positive for depression. The patient is nervous/anxious.        Grief    Health Maintenance  Topic Date Due  . URINE MICROALBUMIN  Never done  . DEXA SCAN  Never done  .  TETANUS/TDAP  11/09/2019  . COVID-19 Vaccine (4 - Booster for Pfizer series) 02/07/2021  . INFLUENZA VACCINE  Completed  . PNA vac Low Risk Adult  Completed  . HPV VACCINES  Aged Out    Physical Exam: Vitals:   01/17/21 0949  BP: 116/62  Pulse: 83  Temp: (!) 97.5 F (36.4 C)  TempSrc: Temporal  SpO2: 98%  Weight: 114 lb (51.7 kg)  Height: 5\' 3"  (1.6 m)   Body mass index is 20.19 kg/m. Physical Exam Vitals reviewed.  Constitutional:      Appearance: Normal appearance.  HENT:     Mouth/Throat:     Comments: Area remains swollen on lower right gums where teeth  extracted, small opening on anterior aspect, no drainage seen Eyes:     Conjunctiva/sclera: Conjunctivae normal.  Cardiovascular:     Rate and Rhythm: Normal rate and regular rhythm.     Pulses: Normal pulses.     Heart sounds: Normal heart sounds.  Pulmonary:     Effort: Pulmonary effort is normal.     Breath sounds: Normal breath sounds. No wheezing, rhonchi or rales.  Abdominal:     General: Bowel sounds are normal. There is no distension.     Tenderness: There is no abdominal tenderness.  Musculoskeletal:        General: Normal range of motion.     Right lower leg: No edema.     Left lower leg: No edema.  Skin:    Comments: Solar lentigos of face  Neurological:     General: No focal deficit present.     Mental Status: She is alert and oriented to person, place, and time.  Psychiatric:     Comments: Tearful several times during visit, admits to sadness and wanting to die     Labs reviewed: Basic Metabolic Panel: Recent Labs    07/19/20 1008  NA 141  K 4.0  CL 106  CO2 26  GLUCOSE 89  BUN 15  CREATININE 0.72  CALCIUM 9.0  TSH 0.81   Liver Function Tests: Recent Labs    07/19/20 1008  AST 17  ALT 16  BILITOT 0.9  PROT 6.5   No results for input(s): LIPASE, AMYLASE in the last 8760 hours. No results for input(s): AMMONIA in the last 8760 hours. CBC: Recent Labs    07/19/20 1008  WBC 6.6  NEUTROABS 4,528  HGB 11.1*  HCT 39.0  MCV 68.8*  PLT 258   Lipid Panel: Recent Labs    07/19/20 1008  CHOL 173  HDL 61  LDLCALC 92  TRIG 100  CHOLHDL 2.8   Lab Results  Component Value Date   HGBA1C 5.7 (H) 07/19/2020    Assessment/Plan 1. Depression, major, single episode, moderate (HCC) - agrees to try another antidepressant -never really took lexapro consistently and did not tolerate remeron - buPROPion (WELLBUTRIN XL) 150 MG 24 hr tablet; Take 1 tablet (150 mg total) by mouth daily.  Dispense: 30 tablet; Refill: 3 -f/u in 6 wks with Amy   2.  Essential hypertension -BP at goal with current regimen  3. Thalassemia minor -f/u cbc at one year  4. Prediabetes -f/u hba1c at one year  5. Age-related osteoporosis without current pathological fracture -f/u bone density--Cynthia is going to call to schedule  Labs/tests ordered:   Lab Orders  No laboratory test(s) ordered today   Next appt:  6 wks f/u on depression   Chemika Nightengale L. Xayvion Shirah, D.O. [facility] [address], [place] [address] Cell Phone (Mon-Fri 8am-5pm):  [phone] On Call:  [phone] & follow prompts after 5pm & weekends Office Phone:  [phone] Office Fax:  [phone]

## 2021-01-29 ENCOUNTER — Telehealth: Payer: Self-pay | Admitting: Orthopedic Surgery

## 2021-01-29 NOTE — Telephone Encounter (Signed)
Called to schedule AWV, left vm to call back to schedule.

## 2021-02-28 ENCOUNTER — Other Ambulatory Visit: Payer: PPO

## 2021-03-07 ENCOUNTER — Ambulatory Visit: Payer: PPO | Admitting: Orthopedic Surgery

## 2021-03-08 ENCOUNTER — Ambulatory Visit: Payer: PPO | Admitting: Orthopedic Surgery

## 2021-03-27 ENCOUNTER — Other Ambulatory Visit (HOSPITAL_COMMUNITY): Payer: Self-pay

## 2021-03-27 ENCOUNTER — Other Ambulatory Visit: Payer: Self-pay | Admitting: Internal Medicine

## 2021-03-27 DIAGNOSIS — I10 Essential (primary) hypertension: Secondary | ICD-10-CM

## 2021-03-27 MED ORDER — AMLODIPINE BESYLATE 5 MG PO TABS
5.0000 mg | ORAL_TABLET | Freq: Every day | ORAL | 1 refills | Status: AC
  Filled 2021-03-27: qty 90, 90d supply, fill #0
  Filled 2021-07-01: qty 90, 90d supply, fill #1

## 2021-03-27 NOTE — Telephone Encounter (Signed)
Daughter called requesting refill.

## 2021-03-28 ENCOUNTER — Other Ambulatory Visit: Payer: Self-pay

## 2021-03-28 ENCOUNTER — Encounter: Payer: Self-pay | Admitting: Orthopedic Surgery

## 2021-03-28 ENCOUNTER — Ambulatory Visit (INDEPENDENT_AMBULATORY_CARE_PROVIDER_SITE_OTHER): Payer: PPO | Admitting: Orthopedic Surgery

## 2021-03-28 VITALS — BP 120/70 | HR 74 | Temp 97.7°F | Ht 63.0 in | Wt 117.8 lb

## 2021-03-28 DIAGNOSIS — R21 Rash and other nonspecific skin eruption: Secondary | ICD-10-CM | POA: Diagnosis not present

## 2021-03-28 NOTE — Patient Instructions (Signed)
May try Zyrtec for 2 weeks to help with itch Recommend applying hydrocortisone 1% cream- apply to areas twice a day as needed for rash  Rash, Adult  A rash is a change in the color of your skin. A rash can also change the way your skin feels. There are many different conditions and factors that can cause a rash. Follow these instructions at home: The goal of treatment is to stop the itching and keep the rash from spreading. Watch for any changes in your symptoms. Let your doctor know about them. Follow these instructions to help with your condition: Medicine Take or apply over-the-counter and prescription medicines only as told by your doctor. These may include medicines:  To treat red or swollen skin (corticosteroid creams).  To treat itching.  To treat an allergy (oral antihistamines).  To treat very bad symptoms (oral corticosteroids).   Skin care  Put cool cloths (compresses) on the affected areas.  Do not scratch or rub your skin.  Avoid covering the rash. Make sure that the rash is exposed to air as much as possible. Managing itching and discomfort  Avoid hot showers or baths. These can make itching worse. A cold shower may help.  Try taking a bath with: ? Epsom salts. You can get these at your local pharmacy or grocery store. Follow the instructions on the package. ? Baking soda. Pour a small amount into the bath as told by your doctor. ? Colloidal oatmeal. You can get this at your local pharmacy or grocery store. Follow the instructions on the package.  Try putting baking soda paste onto your skin. Stir water into baking soda until it gets like a paste.  Try putting on a lotion that relieves itchiness (calamine lotion).  Keep cool and out of the sun. Sweating and being hot can make itching worse. General instructions  Rest as needed.  Drink enough fluid to keep your pee (urine) pale yellow.  Wear loose-fitting clothing.  Avoid scented soaps, detergents, and  perfumes. Use gentle soaps, detergents, perfumes, and other cosmetic products.  Avoid anything that causes your rash. Keep a journal to help track what causes your rash. Write down: ? What you eat. ? What cosmetic products you use. ? What you drink. ? What you wear. This includes jewelry.  Keep all follow-up visits as told by your doctor. This is important.   Contact a doctor if:  You sweat at night.  You lose weight.  You pee (urinate) more than normal.  You pee less than normal, or you notice that your pee is a darker color than normal.  You feel weak.  You throw up (vomit).  Your skin or the whites of your eyes look yellow (jaundice).  Your skin: ? Tingles. ? Is numb.  Your rash: ? Does not go away after a few days. ? Gets worse.  You are: ? More thirsty than normal. ? More tired than normal.  You have: ? New symptoms. ? Pain in your belly (abdomen). ? A fever. ? Watery poop (diarrhea). Get help right away if:  You have a fever and your symptoms suddenly get worse.  You start to feel mixed up (confused).  You have a very bad headache or a stiff neck.  You have very bad joint pains or stiffness.  You have jerky movements that you cannot control (seizure).  Your rash covers all or most of your body. The rash may or may not be painful.  You have blisters that: ?  Are on top of the rash. ? Grow larger. ? Grow together. ? Are painful. ? Are inside your nose or mouth.  You have a rash that: ? Looks like purple pinprick-sized spots all over your body. ? Has a "bull's eye" or looks like a target. ? Is red and painful, causes your skin to peel, and is not from being in the sun too long. Summary  A rash is a change in the color of your skin. A rash can also change the way your skin feels.  The goal of treatment is to stop the itching and keep the rash from spreading.  Take or apply over-the-counter and prescription medicines only as told by your  doctor.  Contact a doctor if you have new symptoms or symptoms that get worse.  Keep all follow-up visits as told by your doctor. This is important. This information is not intended to replace advice given to you by your health care provider. Make sure you discuss any questions you have with your health care provider. Document Revised: 02/11/2019 Document Reviewed: 05/24/2018 Elsevier Patient Education  2021 [employer].

## 2021-03-28 NOTE — Progress Notes (Signed)
Careteam: Patient Care Team: Yvonna Alanis, NP as PCP - General (Adult Health Nurse Practitioner)  Seen by: Windell Moulding, AGNP-C  PLACE OF SERVICE:  [place] Directive information    Allergies  Allergen Reactions  . Lisinopril-Hydrochlorothiazide     Chief Complaint  Patient presents with  . Acute Visit    Rash on leg. Patient also wants a dermatology referral. Patient has had rash since January. Sometimes itching. Rash turns a little red at times. Rash is on left outer thigh.     HPI: Patient is a 86 y.o. female seen today for acute visit due to skin rash.   Son in law present during encounter.   Skin rash started a few weeks ago. Initially pea-sized spot to upper left thigh. Overtime it grew over her anterior part of thigh. Intermittent itching. She has tried to use coconut oil and OTC moisturizer to help with rash. She does spend time outside in her small garden, but denies any contact with poison ivy recently. Also denies trying any new topical products or contact with any allergens. Requesting dermatology referral for Dr. Allyn Kenner.       Review of Systems:  Review of Systems  Constitutional: Negative.   Respiratory: Negative for cough, shortness of breath and wheezing.   Cardiovascular: Negative for chest pain and leg swelling.  Skin: Positive for itching and rash.  Psychiatric/Behavioral: Negative for depression. The patient is not nervous/anxious.     Past Medical History:  Diagnosis Date  . Cataracts   . GERD (gastroesophageal reflux disease)   . Grief reaction   . Guttate hypomelanosis   . Hypertension   . Onychomycosis   . Osteoporosis   . Ovarian mass   . Prediabetes   . Rotator cuff syndrome   . Thalassanemia    Past Surgical History:  Procedure Laterality Date  . CATARACT EXTRACTION, BILATERAL Bilateral 2021  . TOOTH EXTRACTION  01/07/2021   2 teeth pulled   . UVULA MASS EXCISION  12/20/2014   [facility]    Social  History:   reports that she has never smoked. She has never used smokeless tobacco. She reports that she does not drink alcohol and does not use drugs.  Family History  Problem Relation Age of Onset  . Liver disease Father 6    Medications: Patient's Medications  New Prescriptions   No medications on file  Previous Medications   AMLODIPINE (NORVASC) 5 MG TABLET    Take 1 tablet (5 mg total) by mouth daily.   CALCIUM PO    Take by mouth.   GLUCOSAMINE-CHONDROIT-CALCIUM (TRIPLE FLEX BONE & JOINT PO)    Take 50 mg by mouth.   IBUPROFEN (ADVIL) 400 MG TABLET    TAKE 1 TABLET BY MOUTH EVERY 4 HOURS AS NEEDED FOR PAIN. NO MORE THAN 6 TABLETS PER DAY.   MAGNESIUM PO    Take by mouth.   VITAMIN D PO    Take 3,000 Units by mouth daily.  Modified Medications   No medications on file  Discontinued Medications   AMOXICILLIN (AMOXIL) 500 MG CAPSULE    TAKE 1 CAPSULE 3 TIMES DAILY UNTIL GONE.   BUPROPION (WELLBUTRIN XL) 150 MG 24 HR TABLET    TAKE 1 TABLET (150 MG TOTAL) BY MOUTH DAILY.   CHLORHEXIDINE (PERIDEX) 0.12 % SOLUTION    RINSE MOUTH WITH 15ML (1 CAPFUL) FOR 30 SECONDS MORNING & EVENING AFTER TOOTHBRUSHING. SPIT OUT AFTER RINSING, DO NOT SWALLOW.  DEXAMETHASONE (DECADRON) 4 MG TABLET    TAKE 1 TABLET BY MOUTH THREE TIMES DAILY.    Physical Exam:  Vitals:   03/28/21 1422  BP: 120/70  Pulse: 74  Temp: 97.7 F (36.5 C)  SpO2: 97%  Weight: 117 lb 12.8 oz (53.4 kg)  Height: 5\' 3"  (1.6 m)   Body mass index is 20.87 kg/m. Wt Readings from Last 3 Encounters:  03/28/21 117 lb 12.8 oz (53.4 kg)  01/17/21 114 lb (51.7 kg)  07/19/20 113 lb 12.8 oz (51.6 kg)    Physical Exam Vitals reviewed.  Constitutional:      General: She is not in acute distress. HENT:     Head: Normocephalic.  Cardiovascular:     Rate and Rhythm: Normal rate and regular rhythm.     Pulses: Normal pulses.     Heart sounds: Normal heart sounds. No murmur heard.   Skin:    General: Skin is warm and  dry.     Findings: Rash present.       Neurological:     General: No focal deficit present.     Mental Status: She is alert and oriented to person, place, and time.  Psychiatric:        Mood and Affect: Mood normal.        Behavior: Behavior normal.     Labs reviewed: Basic Metabolic Panel: Recent Labs    07/19/20 1008  NA 141  K 4.0  CL 106  CO2 26  GLUCOSE 89  BUN 15  CREATININE 0.72  CALCIUM 9.0  TSH 0.81   Liver Function Tests: Recent Labs    07/19/20 1008  AST 17  ALT 16  BILITOT 0.9  PROT 6.5   No results for input(s): LIPASE, AMYLASE in the last 8760 hours. No results for input(s): AMMONIA in the last 8760 hours. CBC: Recent Labs    07/19/20 1008  WBC 6.6  NEUTROABS 4,528  HGB 11.1*  HCT 39.0  MCV 68.8*  PLT 258   Lipid Panel: Recent Labs    07/19/20 1008  CHOL 173  HDL 61  LDLCALC 92  TRIG 100  CHOLHDL 2.8   TSH: Recent Labs    07/19/20 1008  TSH 0.81   A1C: Lab Results  Component Value Date   HGBA1C 5.7 (H) 07/19/2020     Assessment/Plan 1. Rash and nonspecific skin eruption - left anterior thigh with notable red-brown papules, no drainage - reports some itching - recommend zyrtec 10 mg po Qhs x 2 weeks to help with itching- may trial and then continue if helpful - recommend hydrocortisone 1%- apply to left thigh bid prn for itch - Ambulatory referral to Dermatology  Total time: 18 minutes. Greater than 50 % of total time spent doing patient education and coordination of care for symptom management.   Next appt: 04/18/2021 Windell Moulding, [facility] [phone]

## 2021-04-18 ENCOUNTER — Ambulatory Visit: Payer: PPO | Admitting: Orthopedic Surgery

## 2021-05-02 ENCOUNTER — Other Ambulatory Visit (HOSPITAL_COMMUNITY): Payer: Self-pay

## 2021-05-02 DIAGNOSIS — L120 Bullous pemphigoid: Secondary | ICD-10-CM | POA: Diagnosis not present

## 2021-05-02 DIAGNOSIS — L814 Other melanin hyperpigmentation: Secondary | ICD-10-CM | POA: Diagnosis not present

## 2021-05-02 DIAGNOSIS — L988 Other specified disorders of the skin and subcutaneous tissue: Secondary | ICD-10-CM | POA: Diagnosis not present

## 2021-05-02 MED ORDER — TRIAMCINOLONE ACETONIDE 0.1 % EX CREA
TOPICAL_CREAM | CUTANEOUS | 3 refills | Status: DC
  Filled 2021-05-02: qty 454, 30d supply, fill #0

## 2021-05-02 MED ORDER — TRIAMCINOLONE ACETONIDE 0.1 % EX OINT
TOPICAL_OINTMENT | CUTANEOUS | 3 refills | Status: DC
  Filled 2021-05-02: qty 454, 90d supply, fill #0

## 2021-05-08 ENCOUNTER — Other Ambulatory Visit (HOSPITAL_COMMUNITY): Payer: Self-pay

## 2021-05-08 MED ORDER — CEPHALEXIN 500 MG PO CAPS
500.0000 mg | ORAL_CAPSULE | Freq: Three times a day (TID) | ORAL | 0 refills | Status: DC
  Filled 2021-05-08: qty 21, 7d supply, fill #0

## 2021-05-08 MED ORDER — MUPIROCIN 2 % EX OINT
TOPICAL_OINTMENT | CUTANEOUS | 0 refills | Status: DC
  Filled 2021-05-08: qty 22, 15d supply, fill #0

## 2021-05-10 ENCOUNTER — Other Ambulatory Visit (HOSPITAL_COMMUNITY): Payer: Self-pay

## 2021-05-10 DIAGNOSIS — Z79899 Other long term (current) drug therapy: Secondary | ICD-10-CM | POA: Diagnosis not present

## 2021-05-10 DIAGNOSIS — L821 Other seborrheic keratosis: Secondary | ICD-10-CM | POA: Diagnosis not present

## 2021-05-10 DIAGNOSIS — L120 Bullous pemphigoid: Secondary | ICD-10-CM | POA: Diagnosis not present

## 2021-05-10 DIAGNOSIS — Z5181 Encounter for therapeutic drug level monitoring: Secondary | ICD-10-CM | POA: Diagnosis not present

## 2021-05-10 MED ORDER — CLOBETASOL PROPIONATE 0.05 % EX CREA
1.0000 "application " | TOPICAL_CREAM | Freq: Two times a day (BID) | CUTANEOUS | 5 refills | Status: DC
  Filled 2021-05-10: qty 60, 30d supply, fill #0
  Filled 2021-05-17: qty 60, 20d supply, fill #1

## 2021-05-10 MED ORDER — PREDNISONE 10 MG PO TABS
40.0000 mg | ORAL_TABLET | Freq: Every day | ORAL | 0 refills | Status: DC
  Filled 2021-05-10: qty 120, 30d supply, fill #0

## 2021-05-10 MED ORDER — MYCOPHENOLATE MOFETIL 500 MG PO TABS
500.0000 mg | ORAL_TABLET | Freq: Two times a day (BID) | ORAL | 0 refills | Status: DC
  Filled 2021-05-10: qty 60, 30d supply, fill #0

## 2021-05-16 ENCOUNTER — Other Ambulatory Visit (HOSPITAL_COMMUNITY): Payer: Self-pay

## 2021-05-17 ENCOUNTER — Other Ambulatory Visit (HOSPITAL_COMMUNITY): Payer: Self-pay

## 2021-06-07 ENCOUNTER — Encounter (HOSPITAL_BASED_OUTPATIENT_CLINIC_OR_DEPARTMENT_OTHER): Payer: Self-pay

## 2021-06-07 ENCOUNTER — Other Ambulatory Visit: Payer: Self-pay

## 2021-06-07 DIAGNOSIS — R1010 Upper abdominal pain, unspecified: Secondary | ICD-10-CM | POA: Insufficient documentation

## 2021-06-07 DIAGNOSIS — R112 Nausea with vomiting, unspecified: Secondary | ICD-10-CM | POA: Diagnosis not present

## 2021-06-07 DIAGNOSIS — Z79899 Other long term (current) drug therapy: Secondary | ICD-10-CM | POA: Diagnosis not present

## 2021-06-07 DIAGNOSIS — I10 Essential (primary) hypertension: Secondary | ICD-10-CM | POA: Diagnosis not present

## 2021-06-07 DIAGNOSIS — R197 Diarrhea, unspecified: Secondary | ICD-10-CM | POA: Insufficient documentation

## 2021-06-07 DIAGNOSIS — Z20822 Contact with and (suspected) exposure to covid-19: Secondary | ICD-10-CM | POA: Diagnosis not present

## 2021-06-07 DIAGNOSIS — K529 Noninfective gastroenteritis and colitis, unspecified: Secondary | ICD-10-CM | POA: Diagnosis not present

## 2021-06-07 MED ORDER — ONDANSETRON HCL 4 MG/2ML IJ SOLN
4.0000 mg | Freq: Once | INTRAMUSCULAR | Status: AC | PRN
  Administered 2021-06-07: 4 mg via INTRAVENOUS
  Filled 2021-06-07: qty 2

## 2021-06-07 NOTE — ED Triage Notes (Signed)
Pt is present for N/V/D and severe central abd pain that started at 1930 this evening. Pt has had two episodes of diarrhea and three episodes of emesis. Pt finished Keflex abx on 05/20/21. Denies eating anything unusual.

## 2021-06-08 ENCOUNTER — Emergency Department (HOSPITAL_BASED_OUTPATIENT_CLINIC_OR_DEPARTMENT_OTHER): Payer: PPO

## 2021-06-08 ENCOUNTER — Emergency Department (HOSPITAL_BASED_OUTPATIENT_CLINIC_OR_DEPARTMENT_OTHER)
Admission: EM | Admit: 2021-06-08 | Discharge: 2021-06-08 | Disposition: A | Discharge status: Home / Self Care | Payer: PPO | Attending: Emergency Medicine | Admitting: Emergency Medicine

## 2021-06-08 ENCOUNTER — Encounter (HOSPITAL_BASED_OUTPATIENT_CLINIC_OR_DEPARTMENT_OTHER): Payer: Self-pay | Admitting: Radiology

## 2021-06-08 DIAGNOSIS — R1110 Vomiting, unspecified: Secondary | ICD-10-CM | POA: Diagnosis not present

## 2021-06-08 DIAGNOSIS — K529 Noninfective gastroenteritis and colitis, unspecified: Secondary | ICD-10-CM

## 2021-06-08 DIAGNOSIS — R109 Unspecified abdominal pain: Secondary | ICD-10-CM | POA: Diagnosis not present

## 2021-06-08 LAB — RESP PANEL BY RT-PCR (FLU A&B, COVID) ARPGX2
Influenza A by PCR: NEGATIVE
Influenza B by PCR: NEGATIVE
SARS Coronavirus 2 by RT PCR: NEGATIVE

## 2021-06-08 LAB — COMPREHENSIVE METABOLIC PANEL
ALT: 34 U/L (ref 0–44)
AST: 20 U/L (ref 15–41)
Albumin: 4.1 g/dL (ref 3.5–5.0)
Alkaline Phosphatase: 54 U/L (ref 38–126)
Anion gap: 11 (ref 5–15)
BUN: 27 mg/dL — ABNORMAL HIGH (ref 8–23)
CO2: 25 mmol/L (ref 22–32)
Calcium: 8.8 mg/dL — ABNORMAL LOW (ref 8.9–10.3)
Chloride: 103 mmol/L (ref 98–111)
Creatinine, Ser: 0.7 mg/dL (ref 0.44–1.00)
GFR, Estimated: >60 mL/min (ref >60)
Glucose, Bld: 90 mg/dL (ref 70–99)
Potassium: 3.4 mmol/L — ABNORMAL LOW (ref 3.5–5.1)
Sodium: 139 mmol/L (ref 135–145)
Total Bilirubin: 1.4 mg/dL — ABNORMAL HIGH (ref 0.3–1.2)
Total Protein: 6.4 g/dL — ABNORMAL LOW (ref 6.5–8.1)

## 2021-06-08 LAB — CBC
HCT: 35.4 % — ABNORMAL LOW (ref 36.0–46.0)
Hemoglobin: 11 g/dL — ABNORMAL LOW (ref 12.0–15.0)
MCH: 19.4 pg — ABNORMAL LOW (ref 26.0–34.0)
MCHC: 31.1 g/dL (ref 30.0–36.0)
MCV: 62.3 fL — ABNORMAL LOW (ref 80.0–100.0)
Platelets: 264 10*3/uL (ref 150–400)
RBC: 5.68 MIL/uL — ABNORMAL HIGH (ref 3.87–5.11)
RDW: 19.1 % — ABNORMAL HIGH (ref 11.5–15.5)
WBC: 19.8 10*3/uL — ABNORMAL HIGH (ref 4.0–10.5)
nRBC: 0 % (ref 0.0–0.2)

## 2021-06-08 LAB — URINALYSIS, ROUTINE W REFLEX MICROSCOPIC
Bilirubin Urine: NEGATIVE
Glucose, UA: NEGATIVE mg/dL
Hgb urine dipstick: NEGATIVE
Ketones, ur: NEGATIVE mg/dL
Nitrite: NEGATIVE
Protein, ur: NEGATIVE mg/dL
Specific Gravity, Urine: 1.017 (ref 1.005–1.030)
pH: 6 (ref 5.0–8.0)

## 2021-06-08 LAB — LIPASE, BLOOD: Lipase: 101 U/L — ABNORMAL HIGH (ref 11–51)

## 2021-06-08 MED ORDER — ONDANSETRON HCL 4 MG/2ML IJ SOLN
4.0000 mg | Freq: Once | INTRAMUSCULAR | Status: AC
  Administered 2021-06-08: 4 mg via INTRAVENOUS
  Filled 2021-06-08: qty 2

## 2021-06-08 MED ORDER — ONDANSETRON HCL 4 MG PO TABS: 4.0000 mg | ORAL_TABLET | Freq: Four times a day (QID) | ORAL | 0 refills | Status: AC

## 2021-06-08 MED ORDER — SODIUM CHLORIDE 0.9 % IV BOLUS
500.0000 mL | Freq: Once | INTRAVENOUS | Status: AC
  Administered 2021-06-08: 500 mL via INTRAVENOUS

## 2021-06-08 MED ORDER — IOHEXOL 300 MG/ML  SOLN
75.0000 mL | Freq: Once | INTRAMUSCULAR | Status: AC | PRN
  Administered 2021-06-08: 75 mL via INTRAVENOUS

## 2021-06-08 MED ORDER — IOHEXOL 9 MG/ML PO SOLN
500.0000 mL | ORAL | Status: AC
Start: 2021-06-08 — End: 2021-06-08
  Administered 2021-06-08 (×2): 500 mL via ORAL

## 2021-06-08 NOTE — ED Provider Notes (Signed)
[facility] EMERGENCY DEPT Provider Note   CSN: [id number] Arrival date & time: 06/07/21  2119     History Chief Complaint  Patient presents with   Abdominal Pain   Emesis   Nausea    Joanne Murray is a 86 y.o. female.  Patient presents to the emergency department for evaluation of abdominal pain with nausea, vomiting and diarrhea.  Symptoms began around 7:30 PM.  Patient with severe mid and upper abdominal pain causing her to have difficulty even walking.  She did vomit multiple times and this seems to have helped.  She reports that around 1 AM symptoms significantly improved.  No previous history of ulcer, gastritis, diverticulitis, etc.  She has been on high-dose prednisone for approximately a month because of a recent diagnosis of pemphigus.  She also recently finished a course of Keflex for possible infection of biopsy site.      Past Medical History:  Diagnosis Date   Cataracts    GERD (gastroesophageal reflux disease)    Grief reaction    Guttate hypomelanosis    Hypertension    Onychomycosis    Osteoporosis    Ovarian mass    Prediabetes    Rotator cuff syndrome    Thalassanemia     Patient Active Problem List   Diagnosis Date Noted   Essential hypertension 03/19/2020   Nocturnal leg cramps 03/19/2020   Left-sided tinnitus 03/19/2020   Hair loss 03/19/2020   Depression, major, single episode, moderate ([place]) 03/19/2020   GERD (gastroesophageal reflux disease) 01/31/2020   Osteoporosis 01/31/2020   Thalassemia 01/31/2020   Prediabetes 01/31/2020   Skin lesion 01/31/2020   Nail problem 01/31/2020   Screening for condition 01/31/2020   Grief reaction 01/31/2020    Past Surgical History:  Procedure Laterality Date   CATARACT EXTRACTION, BILATERAL Bilateral 2021   TOOTH EXTRACTION  01/07/2021   2 teeth pulled    UVULA MASS EXCISION  12/20/2014   [facility]      OB History   No obstetric history on file.     Family History   Problem Relation Age of Onset   Liver disease Father 34    Social History   Tobacco Use   Smoking status: Never   Smokeless tobacco: Never  Vaping Use   Vaping Use: Never used  Substance Use Topics   Alcohol use: Never   Drug use: Never    Home Medications Prior to Admission medications   Medication Sig Start Date End Date Taking? Authorizing Provider  ondansetron (ZOFRAN) 4 MG tablet Take 1 tablet (4 mg total) by mouth every 6 (six) hours. 06/08/21  Yes Raliyah Montella, Gwenyth Allegra, MD  amLODipine (NORVASC) 5 MG tablet Take 1 tablet (5 mg total) by mouth daily. 03/27/21   [place], Amy E, NP  CALCIUM PO Take by mouth.    [provider]  cephALEXin (KEFLEX) 500 MG capsule Take 1 capsule (500 mg total) by mouth 3 (three) times daily for 7 days 05/08/21     clobetasol cream (TEMOVATE) [id number] % Apply 1 application topically on itchy spots 2 (two) times daily till next visit. Do not use on face. 05/10/21     Glucosamine-Chondroit-Calcium (TRIPLE FLEX BONE & JOINT PO) Take 50 mg by mouth.    [provider]  ibuprofen (ADVIL) 400 MG tablet TAKE 1 TABLET BY MOUTH EVERY 4 HOURS AS NEEDED FOR PAIN. NO MORE THAN 6 TABLETS PER DAY. 01/07/21 01/07/22  Tamela Oddi, DDS  MAGNESIUM PO Take by mouth.  [provider]  mupirocin ointment (BACTROBAN) 2 % Apply twice daily to biopsy sites 05/08/21     mycophenolate (CELLCEPT) 500 MG tablet Take 1 tablet (500 mg total) by mouth 2 (two) times daily. 05/10/21     predniSONE (DELTASONE) 10 MG tablet Take 4 tablets (40 mg total) by mouth daily. Continue taking until your next visit to dermatology. Patient taking differently: Take 30 mg by mouth daily. 05/10/21     triamcinolone cream (KENALOG) 0.1 % Apply topically to affected areas of rash twice daily as needed (only treat affected areas). Never apply to unaffected skin. Never use on face, groin, or within skin folds 05/02/21     triamcinolone ointment (KENALOG) 0.1 % Apply topically to affected  areas of rash twice daily as needed (only treat affected areas). Never use on face, groin, or within skin folds 05/02/21     VITAMIN D PO Take 3,000 Units by mouth daily.    [provider]    Allergies    Lisinopril-hydrochlorothiazide  Review of Systems   Review of Systems  Constitutional:  Negative for fever.  Gastrointestinal:  Positive for abdominal pain, diarrhea, nausea and vomiting.  All other systems reviewed and are negative.  Physical Exam Updated Vital Signs BP (!) 141/70   Pulse 78   Temp 98.5 F (36.9 C)   Resp 19   Ht '5\' 3"'$  (1.6 m)   Wt 52.6 kg   SpO2 99%   BMI 20.55 kg/m   Physical Exam Vitals and nursing note reviewed.  Constitutional:      General: She is not in acute distress.    Appearance: Normal appearance. She is well-developed.  HENT:     Head: Normocephalic and atraumatic.     Right Ear: Hearing normal.     Left Ear: Hearing normal.     Nose: Nose normal.  Eyes:     Conjunctiva/sclera: Conjunctivae normal.     Pupils: Pupils are equal, round, and reactive to light.  Cardiovascular:     Rate and Rhythm: Regular rhythm.     Heart sounds: S1 normal and S2 normal. No murmur heard.   No friction rub. No gallop.  Pulmonary:     Effort: Pulmonary effort is normal. No respiratory distress.     Breath sounds: Normal breath sounds.  Chest:     Chest wall: No tenderness.  Abdominal:     General: Bowel sounds are normal.     Palpations: Abdomen is soft.     Tenderness: There is abdominal tenderness (slight). There is no guarding or rebound. Negative signs include Murphy's sign and McBurney's sign.     Hernia: No hernia is present.  Musculoskeletal:        General: Normal range of motion.     Cervical back: Normal range of motion and neck supple.  Skin:    General: Skin is warm and dry.     Findings: No rash.  Neurological:     Mental Status: She is alert and oriented to person, place, and time.     GCS: GCS eye subscore is 4. GCS  verbal subscore is 5. GCS motor subscore is 6.     Cranial Nerves: No cranial nerve deficit.     Sensory: No sensory deficit.     Coordination: Coordination normal.  Psychiatric:        Speech: Speech normal.        Behavior: Behavior normal.        Thought Content: Thought content  normal.    ED Results / Procedures / Treatments   Labs (all labs ordered are listed, but only abnormal results are displayed) Labs Reviewed  LIPASE, BLOOD - Abnormal; Notable for the following components:      Result Value   Lipase 101 (*)    All other components within normal limits  COMPREHENSIVE METABOLIC PANEL - Abnormal; Notable for the following components:   Potassium 3.4 (*)    BUN 27 (*)    Calcium 8.8 (*)    Total Protein 6.4 (*)    Total Bilirubin 1.4 (*)    All other components within normal limits  CBC - Abnormal; Notable for the following components:   WBC 19.8 (*)    RBC 5.68 (*)    Hemoglobin 11.0 (*)    HCT 35.4 (*)    MCV 62.3 (*)    MCH 19.4 (*)    RDW 19.1 (*)    All other components within normal limits  URINALYSIS, ROUTINE W REFLEX MICROSCOPIC - Abnormal; Notable for the following components:   Leukocytes,Ua MODERATE (*)    All other components within normal limits  RESP PANEL BY RT-PCR (FLU A&B, COVID) ARPGX2  C DIFFICILE QUICK SCREEN W PCR REFLEX      EKG None  Radiology CT ABDOMEN PELVIS W CONTRAST  Result Date: 06/08/2021 CLINICAL DATA:  Nausea, vomiting and diarrhea with abdominal pain for several hours, initial encounter EXAM: CT ABDOMEN AND PELVIS WITH CONTRAST TECHNIQUE: Multidetector CT imaging of the abdomen and pelvis was performed using the standard protocol following bolus administration of intravenous contrast. CONTRAST:  68m OMNIPAQUE IOHEXOL 300 MG/ML  SOLN COMPARISON:  None. FINDINGS: Lower chest: No acute abnormality. Hepatobiliary: Liver shows a few scattered small cysts. The largest of these is noted in the tip of the right lobe of the liver measuring  2.0 cm in greatest dimension. Gallbladder is within normal limits. Pancreas: Unremarkable. No pancreatic ductal dilatation or surrounding inflammatory changes. Spleen: Normal in size without focal abnormality. Adrenals/Urinary Tract: Adrenal glands are within normal limits. Kidneys are well visualized bilaterally. Renal cysts are noted on the right. Normal excretion is noted on delayed images. Bladder is well distended. Stomach/Bowel: The appendix is not well visualized. No inflammatory changes to suggest appendicitis are noted. Mild small-bowel dilatation is noted without definitive obstructive change. This may represent a degree of enteritis. Stomach is within normal limits. Vascular/Lymphatic: Aortic atherosclerosis. No enlarged abdominal or pelvic lymph nodes. Reproductive: Uterus is within normal limits. The left ovary is not well appreciated. Right ovary is mildly prominent measuring 3.7 cm in greatest dimension. Scattered small cysts as well as a few calcifications are noted within. Other: No abdominal wall hernia or abnormality. No abdominopelvic ascites. Musculoskeletal: No acute or significant osseous findings. IMPRESSION: Hepatic and renal cysts. Mild small bowel dilatation without true obstruction likely representing a degree of enteritis. Mildly prominent right ovary measuring 3.7 cm with multiple smaller cysts within. Recommend follow-up [facility] 6 months. Electronically Signed   By: MInez CatalinaM.D.   On: 06/08/2021 03:46    Procedures Procedures   Medications Ordered in ED Medications  ondansetron (ZOFRAN) injection 4 mg (4 mg Intravenous Given 06/07/21 2324)  sodium chloride 0.9 % bolus 500 mL (0 mLs Intravenous Stopped 06/08/21 0402)  ondansetron (ZOFRAN) injection 4 mg (4 mg Intravenous Given 06/08/21 0139)  iohexol (OMNIPAQUE) 9 MG/ML oral solution 500 mL (500 mLs Oral Contrast Given 06/08/21 0239)  iohexol (OMNIPAQUE) 300 MG/ML solution 75 mL (75 mLs Intravenous  Contrast Given 06/08/21 0318)     ED Course  I have reviewed the triage vital signs and the nursing notes.  Pertinent labs & imaging results that were available during my care of the patient were reviewed by me and considered in my medical decision making (see chart for details).    MDM Rules/Calculators/A&P                          Patient presents to the emergency department for evaluation of abdominal discomfort.  Patient with severe abdominal pain and cramping with nausea, vomiting and diarrhea.  Patient with near resolution of symptoms prior to evaluation.  Patient with elevated white blood cell count and slightly elevated lipase, nonspecific findings.  She is on high-dose steroids for her skin condition.  Remainder of lab work was unremarkable.  CT abdomen and pelvis without acute findings.  Final Clinical Impression(s) / ED Diagnoses Final diagnoses:  None    Rx / DC Orders ED Discharge Orders          Ordered    ondansetron (ZOFRAN) 4 MG tablet  Every 6 hours        06/08/21 0440             Orpah Greek, MD 06/08/21 [phone]

## 2021-06-11 ENCOUNTER — Other Ambulatory Visit (HOSPITAL_COMMUNITY): Payer: Self-pay

## 2021-06-11 DIAGNOSIS — H353221 Exudative age-related macular degeneration, left eye, with active choroidal neovascularization: Secondary | ICD-10-CM | POA: Diagnosis not present

## 2021-06-11 DIAGNOSIS — Z961 Presence of intraocular lens: Secondary | ICD-10-CM | POA: Diagnosis not present

## 2021-06-11 DIAGNOSIS — H52203 Unspecified astigmatism, bilateral: Secondary | ICD-10-CM | POA: Diagnosis not present

## 2021-06-11 DIAGNOSIS — H353111 Nonexudative age-related macular degeneration, right eye, early dry stage: Secondary | ICD-10-CM | POA: Diagnosis not present

## 2021-06-11 MED ORDER — MYCOPHENOLATE MOFETIL 500 MG PO TABS
500.0000 mg | ORAL_TABLET | Freq: Two times a day (BID) | ORAL | 1 refills | Status: DC
  Filled 2021-06-11: qty 60, 30d supply, fill #0

## 2021-06-12 ENCOUNTER — Other Ambulatory Visit (HOSPITAL_COMMUNITY): Payer: Self-pay

## 2021-06-12 MED ORDER — PREDNISONE 10 MG PO TABS
40.0000 mg | ORAL_TABLET | Freq: Every day | ORAL | 0 refills | Status: DC
  Filled 2021-06-12: qty 120, 30d supply, fill #0

## 2021-06-13 NOTE — Progress Notes (Addendum)
[facility] Note  06/17/2021     CHIEF COMPLAINT Patient presents for Retina Evaluation   HISTORY OF PRESENT ILLNESS: Joanne Murray is a 86 y.o. female who presents to the clinic today for:   HPI     Retina Evaluation   In left eye.  This started 5 months ago.        Comments   Retina eval per Dr. Ellie Lunch for wet AMD OS- Patient had noticed a gradual change in her vision OS x4-5 months ago.  Denies using eye drops.  After she had cataract sx she wasn't able to see as well as she hoped states pt OS. She had cataract sx around Jan and March 2022, unsure which eye was first.  Dr. Ellie Lunch did the surgery.  Patient states Dr. Ellie Lunch has discussed with her eyelid surgery.        Last edited by Leonie Douglas, [place] on 06/17/2021  1:53 PM.      Referring physician: Luberta Mutter, MD [place],  [place] [id number]  HISTORICAL INFORMATION:   Selected notes from the MEDICAL RECORD NUMBER Referred by Dr. Ellie Lunch for concern of exudative ARMD OS LEE:  Ocular Hx- PMH-    CURRENT MEDICATIONS: No current outpatient medications on file. (Ophthalmic Drugs)   No current facility-administered medications for this visit. (Ophthalmic Drugs)   Current Outpatient Medications (Other)  Medication Sig   amLODipine (NORVASC) 5 MG tablet Take 1 tablet (5 mg total) by mouth daily.   CALCIUM PO Take by mouth.   clobetasol cream (TEMOVATE) [id number] % Apply 1 application topically on itchy spots 2 (two) times daily till next visit. Do not use on face.   Glucosamine-Chondroit-Calcium (TRIPLE FLEX BONE & JOINT PO) Take 50 mg by mouth.   ibuprofen (ADVIL) 400 MG tablet TAKE 1 TABLET BY MOUTH EVERY 4 HOURS AS NEEDED FOR PAIN. NO MORE THAN 6 TABLETS PER DAY.   MAGNESIUM PO Take by mouth.   mupirocin ointment (BACTROBAN) 2 % Apply twice daily to biopsy sites   mycophenolate (CELLCEPT) 500 MG tablet Take 1 tablet (500 mg total) by mouth 2 (two) times daily.    ondansetron (ZOFRAN) 4 MG tablet Take 1 tablet (4 mg total) by mouth every 6 (six) hours.   predniSONE (DELTASONE) 10 MG tablet Take 4 tablets (40 mg total) by mouth daily. Continue taking until your next visit to dermatology.   triamcinolone cream (KENALOG) 0.1 % Apply topically to affected areas of rash twice daily as needed (only treat affected areas). Never apply to unaffected skin. Never use on face, groin, or within skin folds   cephALEXin (KEFLEX) 500 MG capsule Take 1 capsule (500 mg total) by mouth 3 (three) times daily for 7 days (Patient not taking: Reported on 06/17/2021)   triamcinolone ointment (KENALOG) 0.1 % Apply topically to affected areas of rash twice daily as needed (only treat affected areas). Never use on face, groin, or within skin folds   VITAMIN D PO Take 3,000 Units by mouth daily.   No current facility-administered medications for this visit. (Other)      REVIEW OF SYSTEMS: ROS   Positive for: Gastrointestinal, Musculoskeletal, Eyes Negative for: Constitutional, Neurological, Skin, Genitourinary, HENT, Endocrine, Cardiovascular, Respiratory, Psychiatric, Allergic/Imm, Heme/Lymph Last edited by Leonie Douglas, COA on 06/17/2021  1:31 PM.       ALLERGIES Allergies  Allergen Reactions   Lisinopril-Hydrochlorothiazide     PAST MEDICAL HISTORY Past Medical History:  Diagnosis Date  Cataracts    GERD (gastroesophageal reflux disease)    Grief reaction    Guttate hypomelanosis    Hypertension    Onychomycosis    Osteoporosis    Ovarian mass    Prediabetes    Rotator cuff syndrome    Thalassanemia    Past Surgical History:  Procedure Laterality Date   CATARACT EXTRACTION, BILATERAL Bilateral 2021   TOOTH EXTRACTION  01/07/2021   2 teeth pulled    UVULA MASS EXCISION  12/20/2014   [facility]     FAMILY HISTORY Family History  Problem Relation Age of Onset   Liver disease Father 81    SOCIAL HISTORY Social History   Tobacco Use    Smoking status: Never   Smokeless tobacco: Never  Vaping Use   Vaping Use: Never used  Substance Use Topics   Alcohol use: Never   Drug use: Never         OPHTHALMIC EXAM:  Base Eye Exam     Visual Acuity (Snellen - Linear)       Right Left   Dist cc 20/25 20/80 +1   Dist ph cc NI 20/70         Tonometry (Tonopen, 1:50 PM)       Right Left   Pressure 16 15         Pupils       Dark Light Shape React APD   Right 2 1.5 Round Brisk None   Left 2 1.5 Round Brisk None         Visual Fields (Counting fingers)       Left Right   Restrictions Partial outer inferior temporal deficiency Partial outer inferior nasal deficiency  Poor fixation OU.         Extraocular Movement       Right Left    Full Full         Neuro/Psych     Oriented x3: Yes   Mood/Affect: Normal         Dilation     Both eyes: 1.0% Mydriacyl, 2.5% Phenylephrine @ 1:50 PM           Slit Lamp and Fundus Exam     Slit Lamp Exam       Right Left   Lids/Lashes Dermatochalasis - upper lid, MGD Dermatochalasis - upper lid, MGD   Conjunctiva/Sclera Mild conjunctivochalasis, mild melanosis Mild conjunctivochalasis, mild melanosis, mild nasal and temporal pterygium   Cornea Mild Arcus Mild Arcus, mild nasal and temporal pterygium   Anterior Chamber Deep and quiet Deep and quiet   Iris Round and dilated Round and dilated   Lens PCIOL in good position PCIOL in good position, 1+ PCO   Vitreous Vitreous syneresis Vitreous syneresis, Posterior vitreous detachment         Fundus Exam       Right Left   Disc Mild pallor, Sharp rim, mild PPA Mild pallor, sharp rim, small heme 0600   C/D Ratio 0.5 0.5   Macula Flat, Blunted foveal reflex, Drusen, RPE mottling, no heme or edema Blunted foveal reflex, drusen, RPE mottling, shallow SRF nasal macula following proximal termporal arcades   Vessels Vascular attenuation, Copper wiring Vascular attenuation, Copper wiring   Periphery  Attached, Midzonal drusen, mild reticular degeneration, no heme Attached, Midzonal drusen, mild reticular degeneration, no heme           Refraction     Wearing Rx       Sphere Cylinder Axis  Add   Right -1.50 +1.00 062 +2.50   Left -1.00 +0.50 150 +2.50         Manifest Refraction       Sphere Cylinder Axis Dist VA   Right -1.75 +0.75 060 NI   Left -1.00 +1.00 150 20/60+2            IMAGING AND PROCEDURES  Imaging and Procedures for 06/17/2021  OCT, Retina - OU - Both Eyes       Right Eye Quality was good. Central Foveal Thickness: 228. Progression has no prior data. Findings include no SRF, no IRF, normal foveal contour (Trace vitreous opacities).   Left Eye Quality was good. Central Foveal Thickness: 137. Progression has no prior data. Findings include normal foveal contour, subretinal fluid, no IRF, pigment epithelial detachment, retinal drusen , outer retinal atrophy (Central ORA and foveal thinning; shallow SRF nasal macula and along temporal arcades).   Notes *Images captured and stored on drive  Diagnosis / Impression:  OD: Non exudative ARMD -- NFP; no IRF/no SRF OS: Exudative ARMD vs CSR -- Central ORA and foveal thinning; shallow SRF nasal macula and along temporal arcades  Clinical management:  See below  Abbreviations: NFP - Normal foveal profile. CME - cystoid macular edema. PED - pigment epithelial detachment. IRF - intraretinal fluid. SRF - subretinal fluid. EZ - ellipsoid zone. ERM - epiretinal membrane. ORA - outer retinal atrophy. ORT - outer retinal tubulation. SRHM - subretinal hyper-reflective material. IRHM - intraretinal hyper-reflective material      Fluorescein Angiography Optos (Transit OS)       Right Eye Progression has no prior data. Early phase findings include normal observations. Mid/Late phase findings include normal observations. Choroidal neovascularization is not present.   Left Eye Progression has no prior data. Early  phase findings include window defect, staining. Mid/Late phase findings include staining, window defect, leakage, choroidal neovascularization (Mild CNV sup nasal macula). Choroidal neovascularization is peripapillary.   Notes **Images stored on drive**  Impression: OD: non-exudative ARMD -- no CNV or leakage OS: exudative ARMD w/ peripapillary CNV and mild late leakage            ASSESSMENT/PLAN:    ICD-10-CM   1. Exudative age-related macular degeneration of left eye with active choroidal neovascularization ([place])  H35.3221     2. Retinal edema  H35.81 OCT, Retina - OU - Both Eyes    3. 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 Essential hypertension  I10     5. Hypertensive retinopathy of both eyes  H35.033 Fluorescein Angiography Optos (Transit OS)    6. Pseudophakia of both eyes  Z96.1       1,2. Exudative age related macular degeneration OS - referred by Dr. Ellie Lunch for decreased vision and new SRF OS - pt reports gradual decline in vision OS over several months  - The incidence pathology and anatomy of wet AMD discussed    - The ANCHOR, MARINA, CATT and VIEW trials discussed with patient.    - discussed treatment options including observation vs intravitreal anti-VEGF agents such as Avastin, Lucentis, Eylea.    - Risks of endophthalmitis and vascular occlusive events and atrophic changes discussed with patient             - BCVA OS: 20/60+2  - OCT OS shows: Central ORA and foveal thinning; shallow SRF nasal macula and along temporal arcades -- non central  - FA 8.15.22 shows  peripapillary CNV OS w/ mild leakage  - discussed most likely cause of decreased vision is central ORA rather than shallow peripheral SRF             - discussed possibility of a CSCR component contributing to SRF -- pt currently on po prednisone for bullous pemphigoid. (Pt was on loading dose of '40mg'$  daily, PO + CellCept--managed by Dermatology,  Dr. Ronalee Red)  - recommend IVA OS #1 for SRF / exudative component, but pt leaving for [place] on 09.09.22 for a 2 month stay  - f/u in 2-3 wks, DFE, OCT, possible injection if SRF or vision is worsening  3. Age related macular degeneration, non-exudative, OD  - The incidence, anatomy, and pathology of dry AMD, risk of progression, and the AREDS and AREDS 2 study including smoking risks discussed with patient.             - OCT shows: OD: NFP; no IRF/no SRF             - BCVA OD 20/25  - Recommend amsler grid monitoring  4,5.  Hypertensive retinopathy OU - discussed importance of tight BP control - monitor   6. Pseudophakia OU  - s/p CE/IOL OU  - IOL in good position, doing well  - OS with mild PCO  - monitor   Ophthalmic Meds Ordered this visit:  No orders of the defined types were placed in this encounter.     Return in about 4 weeks (around 07/15/2021) for f/u in 2-3 wks, DFE, OCT.  There are no Patient Instructions on file for this visit.   Explained the diagnoses, plan, and follow up with the patient and they expressed understanding.  Patient expressed understanding of the importance of proper follow up care.   This document serves as a record of services personally performed by Gardiner Sleeper, MD, PhD. It was created on their behalf by Leeann Must, [place], an ophthalmic technician. The creation of this record is the provider's dictation and/or activities during the visit.    Electronically signed by: Leeann Must, COA '@TODAY'$ @ 4:48 PM  Gardiner Sleeper, M.D., Ph.D. Diseases & Surgery of the Retina and Vitreous [facility]  I have reviewed the above documentation for accuracy and completeness, and I agree with the above. Gardiner Sleeper, M.D., Ph.D. 06/17/21 4:48 PM    Abbreviations: M myopia (nearsighted); A astigmatism; H hyperopia (farsighted); P presbyopia; Mrx spectacle prescription;  CTL contact lenses; OD right eye; OS left eye; OU both eyes   XT exotropia; ET esotropia; PEK punctate epithelial keratitis; PEE punctate epithelial erosions; DES dry eye syndrome; MGD meibomian gland dysfunction; ATs artificial tears; PFAT's preservative free artificial tears; [place] nuclear sclerotic cataract; PSC posterior subcapsular cataract; ERM epi-retinal membrane; PVD posterior vitreous detachment; RD retinal detachment; DM diabetes mellitus; DR diabetic retinopathy; NPDR non-proliferative diabetic retinopathy; PDR proliferative diabetic retinopathy; CSME clinically significant macular edema; DME diabetic macular edema; dbh dot blot hemorrhages; CWS cotton wool spot; POAG primary open angle glaucoma; C/D cup-to-disc ratio; HVF humphrey visual field; GVF goldmann visual field; OCT optical coherence tomography; IOP intraocular pressure; BRVO Branch retinal vein occlusion; CRVO central retinal vein occlusion; CRAO central retinal artery occlusion; BRAO branch retinal artery occlusion; RT retinal tear; SB scleral buckle; PPV pars plana vitrectomy; VH Vitreous hemorrhage; PRP panretinal laser photocoagulation; IVK intravitreal kenalog; VMT vitreomacular traction; MH Macular hole;  NVD neovascularization of the disc; NVE neovascularization elsewhere; AREDS age related eye disease study; ARMD age  related macular degeneration; POAG primary open angle glaucoma; EBMD epithelial/anterior basement membrane dystrophy; ACIOL anterior chamber intraocular lens; IOL intraocular lens; PCIOL posterior chamber intraocular lens; Phaco/IOL phacoemulsification with intraocular lens placement; [place] photorefractive keratectomy; LASIK laser assisted in situ keratomileusis; HTN hypertension; DM diabetes mellitus; COPD chronic obstructive pulmonary disease

## 2021-06-17 ENCOUNTER — Other Ambulatory Visit: Payer: Self-pay

## 2021-06-17 ENCOUNTER — Encounter (INDEPENDENT_AMBULATORY_CARE_PROVIDER_SITE_OTHER): Payer: Self-pay | Admitting: Ophthalmology

## 2021-06-17 ENCOUNTER — Ambulatory Visit (INDEPENDENT_AMBULATORY_CARE_PROVIDER_SITE_OTHER): Payer: PPO | Admitting: Ophthalmology

## 2021-06-17 DIAGNOSIS — H353111 Nonexudative age-related macular degeneration, right eye, early dry stage: Secondary | ICD-10-CM

## 2021-06-17 DIAGNOSIS — H3581 Retinal edema: Secondary | ICD-10-CM

## 2021-06-17 DIAGNOSIS — I10 Essential (primary) hypertension: Secondary | ICD-10-CM | POA: Diagnosis not present

## 2021-06-17 DIAGNOSIS — H35033 Hypertensive retinopathy, bilateral: Secondary | ICD-10-CM

## 2021-06-17 DIAGNOSIS — Z961 Presence of intraocular lens: Secondary | ICD-10-CM | POA: Diagnosis not present

## 2021-06-17 DIAGNOSIS — H353221 Exudative age-related macular degeneration, left eye, with active choroidal neovascularization: Secondary | ICD-10-CM

## 2021-06-26 ENCOUNTER — Other Ambulatory Visit (HOSPITAL_COMMUNITY): Payer: Self-pay

## 2021-06-26 DIAGNOSIS — Z5181 Encounter for therapeutic drug level monitoring: Secondary | ICD-10-CM | POA: Diagnosis not present

## 2021-06-26 DIAGNOSIS — L810 Postinflammatory hyperpigmentation: Secondary | ICD-10-CM | POA: Diagnosis not present

## 2021-06-26 DIAGNOSIS — L120 Bullous pemphigoid: Secondary | ICD-10-CM | POA: Diagnosis not present

## 2021-06-26 DIAGNOSIS — L910 Hypertrophic scar: Secondary | ICD-10-CM | POA: Diagnosis not present

## 2021-06-26 DIAGNOSIS — I83893 Varicose veins of bilateral lower extremities with other complications: Secondary | ICD-10-CM | POA: Diagnosis not present

## 2021-06-26 MED ORDER — MYCOPHENOLATE MOFETIL 500 MG PO TABS
500.0000 mg | ORAL_TABLET | Freq: Three times a day (TID) | ORAL | 0 refills | Status: AC
  Filled 2021-06-26: qty 90, 30d supply, fill #0
  Filled 2021-07-01: qty 90, 30d supply, fill #1

## 2021-06-26 MED ORDER — PREDNISONE 10 MG PO TABS
ORAL_TABLET | ORAL | 0 refills | Status: DC
  Filled 2021-06-26: qty 45, 30d supply, fill #0

## 2021-07-01 ENCOUNTER — Other Ambulatory Visit (HOSPITAL_COMMUNITY): Payer: Self-pay

## 2021-07-05 NOTE — Progress Notes (Signed)
[facility] Note  07/09/2021     CHIEF COMPLAINT Patient presents for Retina Follow Up   HISTORY OF PRESENT ILLNESS: Joanne Murray is a 86 y.o. female who presents to the clinic today for:   HPI     Retina Follow Up   Patient presents with  Wet AMD.  In left eye.  Severity is moderate.  Duration of 4 weeks.  I, the attending physician,  performed the HPI with the patient and updated documentation appropriately.        Comments   Patient states difficult to tell if any vision changes OS.       Last edited by Bernarda Caffey, MD on 07/09/2021 12:54 PM.     Referring physician: Luberta Mutter, MD [place],  [place] [id number]  HISTORICAL INFORMATION:   Selected notes from the MEDICAL RECORD NUMBER Referred by Dr. Ellie Lunch for concern of exudative ARMD OS   CURRENT MEDICATIONS: No current outpatient medications on file. (Ophthalmic Drugs)   No current facility-administered medications for this visit. (Ophthalmic Drugs)   Current Outpatient Medications (Other)  Medication Sig   amLODipine (NORVASC) 5 MG tablet Take 1 tablet (5 mg total) by mouth daily.   CALCIUM PO Take by mouth.   clobetasol cream (TEMOVATE) [id number] % Apply 1 application topically on itchy spots 2 (two) times daily till next visit. Do not use on face.   Glucosamine-Chondroit-Calcium (TRIPLE FLEX BONE & JOINT PO) Take 50 mg by mouth.   ibuprofen (ADVIL) 400 MG tablet TAKE 1 TABLET BY MOUTH EVERY 4 HOURS AS NEEDED FOR PAIN. NO MORE THAN 6 TABLETS PER DAY.   MAGNESIUM PO Take by mouth.   mycophenolate (CELLCEPT) 500 MG tablet Take 1 tablet (500 mg total) by mouth 2 (two) times daily.   mycophenolate (CELLCEPT) 500 MG tablet Take 1 tablet (500 mg total) by mouth 3 (three) times daily with meals.   ondansetron (ZOFRAN) 4 MG tablet Take 1 tablet (4 mg total) by mouth every 6 (six) hours.   predniSONE (DELTASONE) 10 MG tablet Take 1.5 tablets (15 mg total) by mouth daily.  Take in the morning after waking up for 2-3 weeks. Then, take 1 tablet ('10mg'$ ) by mouth every morning for 2-3 weeks. Then, take 0.5 tablet ('5mg'$ ) by mouth every morning for 2 weeks until your follow up.   triamcinolone cream (KENALOG) 0.1 % Apply topically to affected areas of rash twice daily as needed (only treat affected areas). Never apply to unaffected skin. Never use on face, groin, or within skin folds   triamcinolone ointment (KENALOG) 0.1 % Apply topically to affected areas of rash twice daily as needed (only treat affected areas). Never use on face, groin, or within skin folds   VITAMIN D PO Take 3,000 Units by mouth daily.   cephALEXin (KEFLEX) 500 MG capsule Take 1 capsule (500 mg total) by mouth 3 (three) times daily for 7 days (Patient not taking: No sig reported)   No current facility-administered medications for this visit. (Other)      REVIEW OF SYSTEMS: ROS   Positive for: Gastrointestinal, Musculoskeletal, Eyes Negative for: Constitutional, Neurological, Skin, Genitourinary, HENT, Endocrine, Cardiovascular, Respiratory, Psychiatric, Allergic/Imm, Heme/Lymph Last edited by Roselee Nova D, COT on 07/09/2021  9:28 AM.        ALLERGIES Allergies  Allergen Reactions   Lisinopril-Hydrochlorothiazide     PAST MEDICAL HISTORY Past Medical History:  Diagnosis Date   Cataracts    GERD (gastroesophageal  reflux disease)    Grief reaction    Guttate hypomelanosis    Hypertension    Onychomycosis    Osteoporosis    Ovarian mass    Prediabetes    Rotator cuff syndrome    Thalassanemia    Past Surgical History:  Procedure Laterality Date   CATARACT EXTRACTION, BILATERAL Bilateral 2021   TOOTH EXTRACTION  01/07/2021   2 teeth pulled    UVULA MASS EXCISION  12/20/2014   [facility]     FAMILY HISTORY Family History  Problem Relation Age of Onset   Liver disease Father 41    SOCIAL HISTORY Social History   Tobacco Use   Smoking status: Never    Smokeless tobacco: Never  Vaping Use   Vaping Use: Never used  Substance Use Topics   Alcohol use: Never   Drug use: Never         OPHTHALMIC EXAM:  Base Eye Exam     Visual Acuity (Snellen - Linear)       Right Left   Dist cc 20/25 20/60 -2   Dist ph cc NI NI    Correction: Glasses         Tonometry (Tonopen, 9:38 AM)       Right Left   Pressure 12 12         Pupils       Dark Light Shape React APD   Right 2 1.5 Round Brisk None   Left 2 1.5 Round Brisk None         Visual Fields (Counting fingers)       Left Right   Restrictions Partial outer inferior temporal deficiency Partial outer inferior nasal deficiency         Extraocular Movement       Right Left    Full, Ortho Full, Ortho         Neuro/Psych     Oriented x3: Yes   Mood/Affect: Normal         Dilation     Both eyes: 1.0% Mydriacyl, 2.5% Phenylephrine @ 9:38 AM           Slit Lamp and Fundus Exam     Slit Lamp Exam       Right Left   Lids/Lashes Dermatochalasis - upper lid, MGD, Ptosis Dermatochalasis - upper lid, MGD, Ptosis   Conjunctiva/Sclera Mild conjunctivochalasis, mild melanosis Mild conjunctivochalasis, mild melanosis, mild nasal and temporal pterygium   Cornea Mild Arcus Mild Arcus, mild nasal and temporal pterygium   Anterior Chamber Deep and quiet Deep and quiet   Iris Round and dilated Round and dilated   Lens PCIOL in good position PCIOL in good position, 1+ PCO   Vitreous Vitreous syneresis Vitreous syneresis, Posterior vitreous detachment         Fundus Exam       Right Left   Disc Mild pallor, Sharp rim, mild PPA Mild pallor, sharp rim, small heme 0600 - resolved   C/D Ratio 0.5 0.5   Macula Flat, Blunted foveal reflex, Drusen, RPE mottling, no heme or edema Blunted foveal reflex, drusen, RPE mottling, shallow SRF nasal macula following proximal termporal arcades - improving   Vessels Vascular attenuation, Copper wiring Vascular attenuation,  Copper wiring   Periphery Attached, Midzonal drusen, mild reticular degeneration, no heme Attached, Midzonal drusen, mild reticular degeneration, no heme           Refraction     Wearing Rx  Sphere Cylinder Axis Add   Right -1.50 +1.00 062 +2.50   Left -1.00 +0.50 150 +2.50            IMAGING AND PROCEDURES  Imaging and Procedures for 07/09/2021  OCT, Retina - OU - Both Eyes       Right Eye Quality was good. Central Foveal Thickness: 231. Progression has been stable. Findings include no SRF, no IRF, normal foveal contour, retinal drusen (Trace vitreous opacities - slightly improved).   Left Eye Quality was good. Central Foveal Thickness: 137. Progression has improved. Findings include normal foveal contour, subretinal fluid, no IRF, pigment epithelial detachment, retinal drusen , outer retinal atrophy (Mild improvement in SRF).   Notes *Images captured and stored on drive  Diagnosis / Impression:  OD: Non exudative ARMD -- NFP; no IRF/no SRF OS: Exudative ARMD vs CSR -- mild interval improvement in SRF  Clinical management:  See below  Abbreviations: NFP - Normal foveal profile. CME - cystoid macular edema. PED - pigment epithelial detachment. IRF - intraretinal fluid. SRF - subretinal fluid. EZ - ellipsoid zone. ERM - epiretinal membrane. ORA - outer retinal atrophy. ORT - outer retinal tubulation. SRHM - subretinal hyper-reflective material. IRHM - intraretinal hyper-reflective material             ASSESSMENT/PLAN:    ICD-10-CM   1. Exudative age-related macular degeneration of left eye with active choroidal neovascularization ([place])  H35.3221     2. Retinal edema  H35.81 OCT, Retina - OU - Both Eyes    3. 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 Essential hypertension  I10     5. Hypertensive retinopathy of both eyes  H35.033     6. Pseudophakia of both eyes  Z96.1      1,2. Exudative age  related macular degeneration OS - referred by Dr. Ellie Lunch for decreased vision and new SRF OS - pt reports gradual decline in vision OS over several months             - BCVA OS: 20/60+2 (improved today from 20/70)  - OCT OS shows: Central ORA and foveal thinning; shallow SRF nasal macula and along temporal arcades -- non central and slightly improved  - FA 8.15.22 shows peripapillary CNV OS w/ mild leakage  - discussed most likely cause of decreased vision is central ORA rather than shallow peripheral SRF             - discussed possibility of a CSCR component contributing to SRF -- pt currently on po prednisone for bullous pemphigoid. (Pt was on loading dose of '40mg'$  daily, PO + CellCept--managed by Dermatology, Dr. Ronalee Red)  - prednisone currently being tapered and CellCept dosing increased  - recommend IVA OS #1 for SRF / exudative component, but pt leaving for [place] on 09.09.22 for a 2 month stay  - recommend deferring IVA for now  - f/u when pt gets back from [place], [place], [place], possible injection if SRF or vision is worsening  3. Age related macular degeneration, non-exudative, OD  - The incidence, anatomy, and pathology of dry AMD, risk of progression, and the AREDS and AREDS 2 study including smoking risks discussed with patient.             - OCT shows: OD: NFP; no IRF/no SRF             - BCVA OD 20/25  - Recommend amsler grid monitoring  4,5.  Hypertensive retinopathy OU - discussed importance of tight BP control - monitor   6. Pseudophakia OU  - s/p CE/IOL OU  - IOL in good position, doing well  - OS with mild PCO  - monitor   Ophthalmic Meds Ordered this visit:  No orders of the defined types were placed in this encounter.     Return for week of 09/16/21, f/u exu ARMD OS, DFE, OCT.  There are no Patient Instructions on file for this visit.  Explained the diagnoses, plan, and follow up with the patient and they expressed understanding.  Patient expressed understanding of  the importance of proper follow up care.   This document serves as a record of services personally performed by Gardiner Sleeper, MD, PhD. It was created on their behalf by Estill Bakes, COT an ophthalmic technician. The creation of this record is the provider's dictation and/or activities during the visit.    Electronically signed by: Estill Bakes, COT 9.2.22 @ 12:55 PM   This document serves as a record of services personally performed by Gardiner Sleeper, MD, PhD. It was created on their behalf by San Jetty. Owens Shark, OA an ophthalmic technician. The creation of this record is the provider's dictation and/or activities during the visit.    Electronically signed by: San Jetty. Marguerita Merles 09.06.2022 12:55 PM   Gardiner Sleeper, M.D., Ph.D. Diseases & Surgery of the Retina and Vitreous [facility]  I have reviewed the above documentation for accuracy and completeness, and I agree with the above. Gardiner Sleeper, M.D., Ph.D. 07/09/21 12:58 PM   Abbreviations: M myopia (nearsighted); A astigmatism; H hyperopia (farsighted); P presbyopia; Mrx spectacle prescription;  CTL contact lenses; OD right eye; OS left eye; OU both eyes  XT exotropia; ET esotropia; PEK punctate epithelial keratitis; PEE punctate epithelial erosions; DES dry eye syndrome; MGD meibomian gland dysfunction; ATs artificial tears; PFAT's preservative free artificial tears; [place] nuclear sclerotic cataract; PSC posterior subcapsular cataract; ERM epi-retinal membrane; PVD posterior vitreous detachment; RD retinal detachment; DM diabetes mellitus; DR diabetic retinopathy; NPDR non-proliferative diabetic retinopathy; PDR proliferative diabetic retinopathy; CSME clinically significant macular edema; DME diabetic macular edema; dbh dot blot hemorrhages; CWS cotton wool spot; POAG primary open angle glaucoma; C/D cup-to-disc ratio; HVF humphrey visual field; GVF goldmann visual field; OCT optical coherence tomography; IOP  intraocular pressure; BRVO Branch retinal vein occlusion; CRVO central retinal vein occlusion; CRAO central retinal artery occlusion; BRAO branch retinal artery occlusion; RT retinal tear; SB scleral buckle; PPV pars plana vitrectomy; VH Vitreous hemorrhage; PRP panretinal laser photocoagulation; IVK intravitreal kenalog; VMT vitreomacular traction; MH Macular hole;  NVD neovascularization of the disc; NVE neovascularization elsewhere; AREDS age related eye disease study; ARMD age related macular degeneration; POAG primary open angle glaucoma; EBMD epithelial/anterior basement membrane dystrophy; ACIOL anterior chamber intraocular lens; IOL intraocular lens; PCIOL posterior chamber intraocular lens; Phaco/IOL phacoemulsification with intraocular lens placement; [place] photorefractive keratectomy; LASIK laser assisted in situ keratomileusis; HTN hypertension; DM diabetes mellitus; COPD chronic obstructive pulmonary disease

## 2021-07-09 ENCOUNTER — Ambulatory Visit (INDEPENDENT_AMBULATORY_CARE_PROVIDER_SITE_OTHER): Payer: PPO | Admitting: Ophthalmology

## 2021-07-09 ENCOUNTER — Other Ambulatory Visit: Payer: Self-pay

## 2021-07-09 ENCOUNTER — Encounter (INDEPENDENT_AMBULATORY_CARE_PROVIDER_SITE_OTHER): Payer: Self-pay | Admitting: Ophthalmology

## 2021-07-09 DIAGNOSIS — Z961 Presence of intraocular lens: Secondary | ICD-10-CM | POA: Diagnosis not present

## 2021-07-09 DIAGNOSIS — H353111 Nonexudative age-related macular degeneration, right eye, early dry stage: Secondary | ICD-10-CM | POA: Diagnosis not present

## 2021-07-09 DIAGNOSIS — H353221 Exudative age-related macular degeneration, left eye, with active choroidal neovascularization: Secondary | ICD-10-CM | POA: Diagnosis not present

## 2021-07-09 DIAGNOSIS — H35033 Hypertensive retinopathy, bilateral: Secondary | ICD-10-CM

## 2021-07-09 DIAGNOSIS — H3581 Retinal edema: Secondary | ICD-10-CM

## 2021-07-09 DIAGNOSIS — I10 Essential (primary) hypertension: Secondary | ICD-10-CM | POA: Diagnosis not present

## 2021-07-31 ENCOUNTER — Encounter: Payer: Self-pay | Admitting: Family

## 2021-07-31 ENCOUNTER — Ambulatory Visit (INDEPENDENT_AMBULATORY_CARE_PROVIDER_SITE_OTHER): Payer: PPO | Admitting: Family

## 2021-07-31 ENCOUNTER — Other Ambulatory Visit: Payer: Self-pay

## 2021-07-31 ENCOUNTER — Ambulatory Visit
Admission: RE | Admit: 2021-07-31 | Discharge: 2021-07-31 | Disposition: A | Discharge status: Home / Self Care | Payer: PPO | Source: Ambulatory Visit | Attending: Family | Admitting: Family

## 2021-07-31 VITALS — BP 90/70 | HR 85 | Temp 98.4°F | Resp 16 | Ht 63.0 in | Wt 119.2 lb

## 2021-07-31 DIAGNOSIS — M25511 Pain in right shoulder: Secondary | ICD-10-CM

## 2021-07-31 NOTE — Patient Instructions (Signed)
-   Please get right shoulder X-ray at [place] at [facility] then will call you with results. - Apply salon pas to right shoulder for pain   - continue on Extra strength Tylenol 1000 mg tablet one by mouth twice daily

## 2021-07-31 NOTE — Progress Notes (Signed)
Provider: Memori Sammon FNP-C  Yvonna Alanis, NP  Patient Care Team: Yvonna Alanis, NP as PCP - General (Adult Health Nurse Practitioner)  Extended Emergency Contact Information Primary Emergency Contact: Krystale, Rinkenberger Home Phone: [phone] Relation: Daughter  Code Status:  DNR Goals of care: Advanced Directive information Advanced Directives 06/07/2021  Does Patient Have a Medical Advance Directive? No  Type of Advance Directive -  Does patient want to make changes to medical advance directive? -  Copy of [place] in Chart? -     Chief Complaint  Patient presents with   Acute Visit    Complains of pain in right shoulder.     HPI:  Pt is a 86 y.o. female seen today for an acute visit for evaluation of right shoulder pain x 2 weeks.she is here with her daughter Arienne, Gartin travelled to [place] for vacation lifted heavy suitcase and also heavy pots of plants.Pain radiates down to upper arm.she is unable to raise arm.Unable to fasten her bra denies any numbness or weakness on right hand.Reports no trauma to shoulder. No previous injuries.Daughter states tends to carry heavy hand bag too over her shoulder. Has taken ibuprofen which did not help.Took 1000 mg tablet of tylenol which helped some.Has salon pas but did not use it.   Also states legs were swollen on her return trip from [place].she wore her compression stockings going but not coming back. Denies any cough or abrupt weight gain.weight at home was 117 lbs which is around her baseline.   Past Medical History:  Diagnosis Date   Cataracts    GERD (gastroesophageal reflux disease)    Grief reaction    Guttate hypomelanosis    Hypertension    Onychomycosis    Osteoporosis    Ovarian mass    Prediabetes    Rotator cuff syndrome    Thalassanemia    Past Surgical History:  Procedure Laterality Date   CATARACT EXTRACTION, BILATERAL Bilateral 2021   TOOTH EXTRACTION  01/07/2021   2  teeth pulled    UVULA MASS EXCISION  12/20/2014   [facility]     Allergies  Allergen Reactions   Lisinopril-Hydrochlorothiazide     Outpatient Encounter Medications as of 07/31/2021  Medication Sig   amLODipine (NORVASC) 5 MG tablet Take 1 tablet (5 mg total) by mouth daily.   CALCIUM PO Take by mouth.   clobetasol cream (TEMOVATE) 9.47 % Apply 1 application topically 2 (two) times daily as needed.   Glucosamine-Chondroit-Calcium (TRIPLE FLEX BONE & JOINT PO) Take 50 mg by mouth.   MAGNESIUM PO Take by mouth.   mycophenolate (CELLCEPT) 500 MG tablet Take 1 tablet (500 mg total) by mouth 3 (three) times daily with meals.   ondansetron (ZOFRAN) 4 MG tablet Take 1 tablet (4 mg total) by mouth every 6 (six) hours.   predniSONE (DELTASONE) 10 MG tablet Take 10 mg by mouth daily with breakfast.   triamcinolone cream (KENALOG) 0.1 % Apply 1 application topically 2 (two) times daily as needed.   VITAMIN D PO Take 3,000 Units by mouth daily.   [DISCONTINUED] cephALEXin (KEFLEX) 500 MG capsule Take 1 capsule (500 mg total) by mouth 3 (three) times daily for 7 days (Patient not taking: No sig reported)   [DISCONTINUED] clobetasol cream (TEMOVATE) 6.54 % Apply 1 application topically on itchy spots 2 (two) times daily till next visit. Do not use on face.   [DISCONTINUED] ibuprofen (ADVIL) 400 MG tablet TAKE 1 TABLET BY  MOUTH EVERY 4 HOURS AS NEEDED FOR PAIN. NO MORE THAN 6 TABLETS PER DAY.   [DISCONTINUED] mycophenolate (CELLCEPT) 500 MG tablet Take 1 tablet (500 mg total) by mouth 2 (two) times daily.   [DISCONTINUED] predniSONE (DELTASONE) 10 MG tablet Take 1.5 tablets (15 mg total) by mouth daily. Take in the morning after waking up for 2-3 weeks. Then, take 1 tablet (10mg ) by mouth every morning for 2-3 weeks. Then, take 0.5 tablet (5mg ) by mouth every morning for 2 weeks until your follow up.   [DISCONTINUED] triamcinolone cream (KENALOG) 0.1 % Apply topically to affected areas of rash  twice daily as needed (only treat affected areas). Never apply to unaffected skin. Never use on face, groin, or within skin folds   [DISCONTINUED] triamcinolone ointment (KENALOG) 0.1 % Apply topically to affected areas of rash twice daily as needed (only treat affected areas). Never use on face, groin, or within skin folds   No facility-administered encounter medications on file as of 07/31/2021.    Review of Systems  Constitutional:  Negative for appetite change, chills, fatigue, fever and unexpected weight change.  Respiratory:  Negative for cough, chest tightness, shortness of breath and wheezing.   Cardiovascular:  Negative for chest pain and palpitations.       Reports swelling on legs   Gastrointestinal:  Negative for abdominal distention and abdominal pain.  Musculoskeletal:  Positive for arthralgias. Negative for back pain, gait problem, joint swelling, myalgias, neck pain and neck stiffness.       Right shoulder pain   Skin:  Negative for color change and pallor.  Neurological:  Negative for weakness and numbness.   Immunization History  Administered Date(s) Administered   Fluad Quad(high Dose 65+) 07/19/2020   Influenza-Unspecified 08/03/2018   PFIZER(Purple Top)SARS-COV-2 Vaccination 12/09/2019, 12/29/2019, 08/09/2020   Pneumococcal Conjugate-13 10/03/2014   Pneumococcal Polysaccharide-23 01/01/2005   Tdap 11/08/2009   Pertinent  Health Maintenance Due  Topic Date Due   URINE MICROALBUMIN  Never done   DEXA SCAN  Never done   INFLUENZA VACCINE  06/03/2021   Fall Risk  01/17/2021 07/19/2020 03/19/2020  Falls in the past year? 0 0 0  Number falls in past yr: 0 0 0  Injury with Fall? 0 0 0   Functional Status Survey:    Vitals:   07/31/21 0959  BP: 90/70  Pulse: 85  Resp: 16  Temp: 98.4 F (36.9 C)  SpO2: 97%  Weight: 119 lb 3.2 oz (54.1 kg)  Height: 5\' 3"  (1.6 m)   Body mass index is 21.12 kg/m. Physical Exam Vitals reviewed.  Constitutional:       General: She is not in acute distress.    Appearance: Normal appearance. She is normal weight. She is not ill-appearing or diaphoretic.  HENT:     Head: Normocephalic.  Eyes:     General: No scleral icterus.       Right eye: No discharge.        Left eye: No discharge.     Conjunctiva/sclera: Conjunctivae normal.     Pupils: Pupils are equal, round, and reactive to light.  Cardiovascular:     Rate and Rhythm: Normal rate and regular rhythm.     Pulses: Normal pulses.     Heart sounds: Normal heart sounds. No murmur heard.   No friction rub. No gallop.  Pulmonary:     Effort: Pulmonary effort is normal. No respiratory distress.     Breath sounds: Normal breath sounds. No wheezing, rhonchi  or rales.  Chest:     Chest wall: No tenderness.  Musculoskeletal:        General: No swelling.     Right shoulder: Tenderness present. No swelling, effusion or crepitus. Decreased range of motion. Normal strength. Normal pulse.     Left shoulder: Normal.     Cervical back: Normal range of motion. No rigidity or tenderness.     Right lower leg: No edema.     Left lower leg: No edema.  Lymphadenopathy:     Cervical: No cervical adenopathy.  Skin:    General: Skin is warm and dry.     Coloration: Skin is not pale.     Findings: No bruising, erythema, lesion or rash.  Neurological:     Mental Status: She is alert and oriented to person, place, and time.     Motor: No weakness.     Gait: Gait normal.  Psychiatric:        Mood and Affect: Mood normal.        Speech: Speech normal.        Behavior: Behavior normal.        Thought Content: Thought content normal.        Judgment: Judgment normal.    Labs reviewed: Recent Labs    06/07/21 2218  NA 139  K 3.4*  CL 103  CO2 25  GLUCOSE 90  BUN 27*  CREATININE 0.70  CALCIUM 8.8*   Recent Labs    06/07/21 2218  AST 20  ALT 34  ALKPHOS 54  BILITOT 1.4*  PROT 6.4*  ALBUMIN 4.1   Recent Labs    06/07/21 2218  WBC 19.8*  HGB  11.0*  HCT 35.4*  MCV 62.3*  PLT 264   Lab Results  Component Value Date   TSH 0.81 07/19/2020   Lab Results  Component Value Date   HGBA1C 5.7 (H) 07/19/2020   Lab Results  Component Value Date   CHOL 173 07/19/2020   HDL 61 07/19/2020   LDLCALC 92 07/19/2020   TRIG 100 07/19/2020   CHOLHDL 2.8 07/19/2020    Significant Diagnostic Results in last 30 days:  OCT, Retina - OU - Both Eyes  Result Date: 07/09/2021 Right Eye Quality was good. Central Foveal Thickness: 231. Progression has been stable. Findings include no SRF, no IRF, normal foveal contour, retinal drusen (Trace vitreous opacities - slightly improved). Left Eye Quality was good. Central Foveal Thickness: 137. Progression has improved. Findings include normal foveal contour, subretinal fluid, no IRF, pigment epithelial detachment, retinal drusen , outer retinal atrophy (Mild improvement in SRF). Notes *Images captured and stored on drive Diagnosis / Impression: OD: Non exudative ARMD -- NFP; no IRF/no SRF OS: Exudative ARMD vs CSR -- mild interval improvement in SRF Clinical management: See below Abbreviations: NFP - Normal foveal profile. CME - cystoid macular edema. PED - pigment epithelial detachment. IRF - intraretinal fluid. SRF - subretinal fluid. EZ - ellipsoid zone. ERM - epiretinal membrane. ORA - outer retinal atrophy. ORT - outer retinal tubulation. SRHM - subretinal hyper-reflective material. IRHM - intraretinal hyper-reflective material    Assessment/Plan   Acute pain of right shoulder Limited ROM with abduction and AC joint tender to palpation.No erythema or swelling noted. Normal pulse and circulation.No weakness noted.  Will obtain imaging to rule out acute abnormalities.  - DG Shoulder Right; Future - Ambulatory referral to Orthopedic Surgery - Extra strength tylenol 1000 mg tablet twice daily and may take 500 mg tablet  daily at 2 pm if needed for pain   Family/ staff Communication: Reviewed plan of care  with patient and daughter verbalized understanding.  Labs/tests ordered: - DG Shoulder Right; Future  Next Appointment: As needed if symptoms worsen or fail to improve    Sandrea Hughs, NP

## 2021-08-01 ENCOUNTER — Ambulatory Visit: Payer: PPO | Admitting: Orthopedic Surgery

## 2021-08-01 DIAGNOSIS — M7541 Impingement syndrome of right shoulder: Secondary | ICD-10-CM | POA: Diagnosis not present

## 2021-08-01 DIAGNOSIS — M7501 Adhesive capsulitis of right shoulder: Secondary | ICD-10-CM

## 2021-08-01 DIAGNOSIS — M25511 Pain in right shoulder: Secondary | ICD-10-CM | POA: Diagnosis not present

## 2021-08-05 ENCOUNTER — Encounter: Payer: Self-pay | Admitting: Orthopedic Surgery

## 2021-08-05 MED ORDER — BUPIVACAINE HCL 0.5 % IJ SOLN
9.0000 mL | INTRAMUSCULAR | Status: AC | PRN
  Administered 2021-08-01: 9 mL via INTRA_ARTICULAR

## 2021-08-05 MED ORDER — LIDOCAINE HCL 1 % IJ SOLN
5.0000 mL | INTRAMUSCULAR | Status: AC | PRN
  Administered 2021-08-01: 5 mL

## 2021-08-05 MED ORDER — METHYLPREDNISOLONE ACETATE 40 MG/ML IJ SUSP
40.0000 mg | INTRAMUSCULAR | Status: AC | PRN
  Administered 2021-08-01: 40 mg via INTRA_ARTICULAR

## 2021-08-05 NOTE — Progress Notes (Signed)
Office Visit Note   Patient: Joanne Murray           Date of Birth: [birth year]           MRN: [id number] Visit Date: 08/01/2021 Requested by: Yvonna Alanis, NP [phone],  [place] [address] PCP: Yvonna Alanis, NP  Subjective: Chief Complaint  Patient presents with   Right Shoulder - Pain    HPI: Patient presents for evaluation of right shoulder pain.  Patient has had pain in the right shoulder for about 3 weeks.  She was picking up some heavy plants and developed overuse type symptoms in the deltoid region.  She also had to carry luggage for a trip.  The pain accelerated over the subsequent 4 days.  She is now having difficulty fastening her bra.  She tried ibuprofen for 5 days and then Tylenol as well as topical.  That did give her some relief.  Patient does live alone.  States that she cannot really iron close but her symptoms have improved marginally over the past week.  She is able to cut her food.  Hard for her to brush her hair.  She likes to play pickle ball but has not done that since the shoulder has acted up.  Symptoms are keeping her up at night.  Denies any mechanical symptoms and the pain does not radiate below the elbow.  She has been on steroids orally for months due to a skin condition.  She is also on an immunosuppressant.  She is here with her daughter who is a physician at [facility] today.              ROS: All systems reviewed are negative as they relate to the chief complaint within the history of present illness.  Patient denies  fevers or chills.   Assessment & Plan: Visit Diagnoses:  1. Adhesive capsulitis of right shoulder     Plan: Impression is right shoulder bursitis possible early adhesive capsulitis.  Structurally her rotator cuff strength is reasonable.  She does have a fairly significant spur on radiographs which has been there long before her current symptoms began.  Plan for subacromial injection with 3-week return and decision for or  against MRI scanning at that time.  Follow-Up Instructions: Return in about 3 weeks (around 08/22/2021).   Orders:  No orders of the defined types were placed in this encounter.  No orders of the defined types were placed in this encounter.     Procedures: Large Joint Inj: R subacromial bursa on 08/01/2021 10:41 PM Indications: diagnostic evaluation and pain Details: 18 G 1.5 in needle, posterior approach  Arthrogram: No  Medications: 9 mL bupivacaine 0.5 %; 40 mg methylPREDNISolone acetate 40 MG/ML; 5 mL lidocaine 1 % Outcome: tolerated well, no immediate complications Procedure, treatment alternatives, risks and benefits explained, specific risks discussed. Consent was given by the patient. Immediately prior to procedure a time out was called to verify the correct patient, procedure, equipment, support staff and site/side marked as required. Patient was prepped and draped in the usual sterile fashion.      Clinical Data: No additional findings.  Objective: Vital Signs: There were no vitals taken for this visit.  Physical Exam:   Constitutional: Patient appears well-developed HEENT:  Head: Normocephalic Eyes:EOM are normal Neck: Normal range of motion Cardiovascular: Normal rate Pulmonary/chest: Effort normal Neurologic: Patient is alert Skin: Skin is warm Psychiatric: Patient has normal mood and affect   Ortho Exam:  Ortho exam demonstrates good cervical spine range of motion.  No discrete AC joint tenderness right versus left.  Passive external rotation is maintained symmetrically in both shoulders to about 70 degrees.  Rotator cuff strength intact infraspinatus supraspinatus subscap muscle testing bilaterally.  No coarse grinding or crepitus with active or passive range of motion of that right shoulder but overhead motion is painful for the patient.  No masses lymphadenopathy or skin changes noted in the shoulder girdle region.  No coarse grinding or crepitus is present  with internal and external rotation at 90 degrees of abduction.  Specialty Comments:  No specialty comments available.  Imaging: No results found.   PMFS History: Patient Active Problem List   Diagnosis Date Noted   Essential hypertension 03/19/2020   Nocturnal leg cramps 03/19/2020   Left-sided tinnitus 03/19/2020   Hair loss 03/19/2020   Depression, major, single episode, moderate (HCC) 03/19/2020   GERD (gastroesophageal reflux disease) 01/31/2020   Osteoporosis 01/31/2020   Thalassemia 01/31/2020   Prediabetes 01/31/2020   Skin lesion 01/31/2020   Nail problem 01/31/2020   Screening for condition 01/31/2020   Grief reaction 01/31/2020   Past Medical History:  Diagnosis Date   Cataracts    GERD (gastroesophageal reflux disease)    Grief reaction    Guttate hypomelanosis    Hypertension    Onychomycosis    Osteoporosis    Ovarian mass    Prediabetes    Rotator cuff syndrome    Thalassanemia     Family History  Problem Relation Age of Onset   Liver disease Father 76    Past Surgical History:  Procedure Laterality Date   CATARACT EXTRACTION, BILATERAL Bilateral 2021   TOOTH EXTRACTION  01/07/2021   2 teeth pulled    UVULA MASS EXCISION  12/20/2014   [facility]    Social History   Occupational History   Not on file  Tobacco Use   Smoking status: Never   Smokeless tobacco: Never  Vaping Use   Vaping Use: Never used  Substance and Sexual Activity   Alcohol use: Never   Drug use: Never   Sexual activity: Not Currently

## 2021-08-21 NOTE — Progress Notes (Signed)
[facility] Note  08/28/2021     CHIEF COMPLAINT Patient presents for Retina Follow Up   HISTORY OF PRESENT ILLNESS: Joanne Murray is a 86 y.o. female who presents to the clinic today for:   HPI     Retina Follow Up   Patient presents with  Wet AMD.  In left eye.  This started months ago.  Severity is moderate.  Duration of 7 weeks.  Since onset it is gradually worsening.  I, the attending physician,  performed the HPI with the patient and updated documentation appropriately.        Comments   86 y/o female pt here for 7 wk f/u for exu ARMD OS.  Feels VA OS is gradually getting worse.  No change in [place] OD.  Denies pain, FOL, floaters.  No gtts.      Last edited by Bernarda Caffey, MD on 08/31/2021  1:38 AM.    Pt states she came home from [place] early bc she hurt her arm, pt feels like her left eye vision is worse  Referring physician: Yvonna Alanis, NP [phone],  [place] [address]  HISTORICAL INFORMATION:   Selected notes from the MEDICAL RECORD NUMBER Referred by Dr. Ellie Lunch for concern of exudative ARMD OS   CURRENT MEDICATIONS: No current outpatient medications on file. (Ophthalmic Drugs)   No current facility-administered medications for this visit. (Ophthalmic Drugs)   Current Outpatient Medications (Other)  Medication Sig   amLODipine (NORVASC) 5 MG tablet Take 1 tablet (5 mg total) by mouth daily.   CALCIUM PO Take by mouth.   clobetasol cream (TEMOVATE) 7.62 % Apply 1 application topically 2 (two) times daily as needed.   Glucosamine-Chondroit-Calcium (TRIPLE FLEX BONE & JOINT PO) Take 50 mg by mouth.   MAGNESIUM PO Take by mouth.   mycophenolate (CELLCEPT) 500 MG tablet Take 1 tablet (500 mg total) by mouth 3 (three) times daily with meals.   mycophenolate (CELLCEPT) 500 MG tablet Take 1 tablet (500 mg total) by mouth 3 (three) times daily with meals.   ondansetron (ZOFRAN) 4 MG tablet Take 1 tablet (4 mg total)  by mouth every 6 (six) hours.   predniSONE (DELTASONE) 10 MG tablet Take 10 mg by mouth daily with breakfast.   predniSONE (DELTASONE) 2.5 MG tablet Take 2 tablets by mouth (5mg ) daily 30 minutes after waking for 1 week, Then, decrease to 1 tablet by mouth (2.5mg ) daily 30 minutes after waking for 4 weeks. Then, stop.   tacrolimus (PROTOPIC) 0.1 % ointment Apply to dark spots on the left thigh twice a day.   triamcinolone cream (KENALOG) 0.1 % Apply 1 application topically 2 (two) times daily as needed.   VITAMIN D PO Take 3,000 Units by mouth daily.   No current facility-administered medications for this visit. (Other)      REVIEW OF SYSTEMS: ROS   Positive for: Gastrointestinal, Musculoskeletal, Eyes Negative for: Constitutional, Neurological, Skin, Genitourinary, HENT, Endocrine, Cardiovascular, Respiratory, Psychiatric, Allergic/Imm, Heme/Lymph Last edited by Matthew Folks, COA on 08/28/2021  8:53 AM.         ALLERGIES Allergies  Allergen Reactions   Lisinopril-Hydrochlorothiazide     PAST MEDICAL HISTORY Past Medical History:  Diagnosis Date   GERD (gastroesophageal reflux disease)    Grief reaction    Guttate hypomelanosis    Hypertension    Hypertensive retinopathy    Macular degeneration    Onychomycosis    Osteoporosis  Ovarian mass    Prediabetes    Rotator cuff syndrome    Thalassanemia    Past Surgical History:  Procedure Laterality Date   CATARACT EXTRACTION     CATARACT EXTRACTION, BILATERAL Bilateral 2021   EYE SURGERY     TOOTH EXTRACTION  01/07/2021   2 teeth pulled    UVULA MASS EXCISION  12/20/2014   [facility]     FAMILY HISTORY Family History  Problem Relation Age of Onset   Liver disease Father 48    SOCIAL HISTORY Social History   Tobacco Use   Smoking status: Never   Smokeless tobacco: Never  Vaping Use   Vaping Use: Never used  Substance Use Topics   Alcohol use: Never   Drug use: Never       OPHTHALMIC  EXAM: Base Eye Exam     Visual Acuity (Snellen - Linear)       Right Left   Dist cc 20/25 20/80 -2   Dist ph cc NI NI    Correction: Glasses         Tonometry (Tonopen, 9:01 AM)       Right Left   Pressure 9 10         Pupils       Dark Light Shape React APD   Right 2 1.5 Round Brisk None   Left 2 1.5 Round Brisk None         Visual Fields (Counting fingers)       Left Right   Restrictions Partial outer inferior temporal deficiency Partial outer inferior nasal deficiency         Neuro/Psych     Oriented x3: Yes   Mood/Affect: Normal         Dilation     Both eyes: 1.0% Mydriacyl, 2.5% Phenylephrine @ 9:01 AM           Slit Lamp and Fundus Exam     Slit Lamp Exam       Right Left   Lids/Lashes Dermatochalasis - upper lid, MGD, Ptosis Dermatochalasis - upper lid, MGD, Ptosis   Conjunctiva/Sclera Mild conjunctivochalasis, mild melanosis Mild conjunctivochalasis, mild melanosis, mild nasal and temporal pterygium   Cornea Mild Arcus Mild Arcus, mild nasal and temporal pterygium   Anterior Chamber Deep and quiet Deep and quiet   Iris Round and dilated Round and dilated   Lens PCIOL in good position PCIOL in good position, 1+ PCO   Vitreous Vitreous syneresis Vitreous syneresis, Posterior vitreous detachment         Fundus Exam       Right Left   Disc Mild pallor, Sharp rim, mild PPA Mild pallor, sharp rim   C/D Ratio 0.5 0.5   Macula Flat, Blunted foveal reflex, Drusen, RPE mottling, no heme or edema Blunted foveal reflex, drusen, RPE mottling, shallow SRF nasal macula following proximal termporal arcades - improving   Vessels Vascular attenuation, Copper wiring Vascular attenuation, Copper wiring   Periphery Attached, Midzonal drusen, mild reticular degeneration, no heme Attached, Midzonal drusen, mild reticular degeneration, no heme           IMAGING AND PROCEDURES  Imaging and Procedures for 08/28/2021  OCT, Retina - OU - Both Eyes        Right Eye Quality was good. Central Foveal Thickness: 228. Progression has been stable. Findings include no SRF, no IRF, normal foveal contour, retinal drusen .   Left Eye Quality was good. Central Foveal Thickness: 133. Progression has  improved. Findings include normal foveal contour, subretinal fluid, no IRF, pigment epithelial detachment, retinal drusen , outer retinal atrophy (Mild interval improvement in SRF SN macula).   Notes *Images captured and stored on drive  Diagnosis / Impression:  OD: Non exudative ARMD -- NFP; no IRF/no SRF OS: Exudative ARMD vs CSR -- Mild interval improvement in SRF SN macula  Clinical management:  See below  Abbreviations: NFP - Normal foveal profile. CME - cystoid macular edema. PED - pigment epithelial detachment. IRF - intraretinal fluid. SRF - subretinal fluid. EZ - ellipsoid zone. ERM - epiretinal membrane. ORA - outer retinal atrophy. ORT - outer retinal tubulation. SRHM - subretinal hyper-reflective material. IRHM - intraretinal hyper-reflective material            ASSESSMENT/PLAN:    ICD-10-CM   1. Exudative age-related macular degeneration of left eye with active choroidal neovascularization ([place])  H35.3221     2. Retinal edema  H35.81 OCT, Retina - OU - Both Eyes    3. 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 Essential hypertension  I10     5. Hypertensive retinopathy of both eyes  H35.033     6. Pseudophakia of both eyes  Z96.1       1,2. Exudative age related macular degeneration OS - referred by Dr. Ellie Lunch for decreased vision and new SRF OS - pt reports gradual decline in vision OS over several months             - BCVA OS: 20/80 from 20/60+2  - OCT OS shows: Central ORA and foveal thinning; shallow SRF nasal macula and along temporal arcades -- non central and slightly improved  - FA 8.15.22 shows peripapillary CNV OS w/ mild leakage  - discussed most likely  cause of decreased vision is central ORA rather than shallow peripheral SRF             - discussed possibility of a CSCR component contributing to SRF -- pt currently on po prednisone for bullous pemphigoid. (Pt was on loading dose of 40mg  daily, PO + CellCept--managed by Dermatology, Dr. Ronalee Red)  - prednisone currently being tapered and CellCept dosing increased  - recommend hold off on injection for now due to improving fluid  - f/u 4 weeks, DFE, OCT  3. Age related macular degeneration, non-exudative, OD  - The incidence, anatomy, and pathology of dry AMD, risk of progression, and the AREDS and AREDS 2 study including smoking risks discussed with patient.             - OCT shows: OD: NFP; no IRF/no SRF             - BCVA OD 20/25 -- stable  - Recommend amsler grid monitoring  4,5. Hypertensive retinopathy OU - discussed importance of tight BP control - monitor   6. Pseudophakia OU  - s/p CE/IOL OU  - IOL in good position, doing well  - OS with mild PCO  - monitor  Ophthalmic Meds Ordered this visit:  No orders of the defined types were placed in this encounter.    Return in about 4 weeks (around 09/25/2021) for f/u exu ARMD OS, DFE, OCT.  There are no Patient Instructions on file for this visit.  This document serves as a record of services personally performed by Gardiner Sleeper, MD, PhD. It was created on their behalf by Orvan Falconer, an ophthalmic technician. The creation of this record  is the provider's dictation and/or activities during the visit.    Electronically signed by: Orvan Falconer, OA, 08/31/21  1:40 AM  This document serves as a record of services personally performed by Gardiner Sleeper, MD, PhD. It was created on their behalf by San Jetty. Owens Shark, OA an ophthalmic technician. The creation of this record is the provider's dictation and/or activities during the visit.    Electronically signed by: San Jetty. Owens Shark, OA 10.26.2022 1:40 AM  Gardiner Sleeper,  M.D., Ph.D. Diseases & Surgery of the Retina and Vitreous [facility]  I have reviewed the above documentation for accuracy and completeness, and I agree with the above. Gardiner Sleeper, M.D., Ph.D. 08/31/21 1:43 AM   Abbreviations: M myopia (nearsighted); A astigmatism; H hyperopia (farsighted); P presbyopia; Mrx spectacle prescription;  CTL contact lenses; OD right eye; OS left eye; OU both eyes  XT exotropia; ET esotropia; PEK punctate epithelial keratitis; PEE punctate epithelial erosions; DES dry eye syndrome; MGD meibomian gland dysfunction; ATs artificial tears; PFAT's preservative free artificial tears; [place] nuclear sclerotic cataract; PSC posterior subcapsular cataract; ERM epi-retinal membrane; PVD posterior vitreous detachment; RD retinal detachment; DM diabetes mellitus; DR diabetic retinopathy; NPDR non-proliferative diabetic retinopathy; PDR proliferative diabetic retinopathy; CSME clinically significant macular edema; DME diabetic macular edema; dbh dot blot hemorrhages; CWS cotton wool spot; POAG primary open angle glaucoma; C/D cup-to-disc ratio; HVF humphrey visual field; GVF goldmann visual field; OCT optical coherence tomography; IOP intraocular pressure; BRVO Branch retinal vein occlusion; CRVO central retinal vein occlusion; CRAO central retinal artery occlusion; BRAO branch retinal artery occlusion; RT retinal tear; SB scleral buckle; PPV pars plana vitrectomy; VH Vitreous hemorrhage; PRP panretinal laser photocoagulation; IVK intravitreal kenalog; VMT vitreomacular traction; MH Macular hole;  NVD neovascularization of the disc; NVE neovascularization elsewhere; AREDS age related eye disease study; ARMD age related macular degeneration; POAG primary open angle glaucoma; EBMD epithelial/anterior basement membrane dystrophy; ACIOL anterior chamber intraocular lens; IOL intraocular lens; PCIOL posterior chamber intraocular lens; Phaco/IOL phacoemulsification with  intraocular lens placement; [address] photorefractive keratectomy; LASIK laser assisted in situ keratomileusis; HTN hypertension; DM diabetes mellitus; COPD chronic obstructive pulmonary disease

## 2021-08-22 ENCOUNTER — Other Ambulatory Visit (HOSPITAL_COMMUNITY): Payer: Self-pay

## 2021-08-22 DIAGNOSIS — Z5181 Encounter for therapeutic drug level monitoring: Secondary | ICD-10-CM | POA: Diagnosis not present

## 2021-08-22 DIAGNOSIS — L089 Local infection of the skin and subcutaneous tissue, unspecified: Secondary | ICD-10-CM | POA: Diagnosis not present

## 2021-08-22 DIAGNOSIS — L120 Bullous pemphigoid: Secondary | ICD-10-CM | POA: Diagnosis not present

## 2021-08-22 DIAGNOSIS — Z79899 Other long term (current) drug therapy: Secondary | ICD-10-CM | POA: Diagnosis not present

## 2021-08-22 DIAGNOSIS — L816 Other disorders of diminished melanin formation: Secondary | ICD-10-CM | POA: Diagnosis not present

## 2021-08-22 DIAGNOSIS — L821 Other seborrheic keratosis: Secondary | ICD-10-CM | POA: Diagnosis not present

## 2021-08-22 DIAGNOSIS — L814 Other melanin hyperpigmentation: Secondary | ICD-10-CM | POA: Diagnosis not present

## 2021-08-22 DIAGNOSIS — L810 Postinflammatory hyperpigmentation: Secondary | ICD-10-CM | POA: Diagnosis not present

## 2021-08-22 DIAGNOSIS — L910 Hypertrophic scar: Secondary | ICD-10-CM | POA: Diagnosis not present

## 2021-08-22 MED ORDER — PREDNISONE 2.5 MG PO TABS
ORAL_TABLET | ORAL | 0 refills | Status: DC
  Filled 2021-08-22: qty 50, 35d supply, fill #0

## 2021-08-22 MED ORDER — TACROLIMUS 0.1 % EX OINT
TOPICAL_OINTMENT | CUTANEOUS | 3 refills | Status: AC
  Filled 2021-08-22: qty 30, 20d supply, fill #0

## 2021-08-22 MED ORDER — MYCOPHENOLATE MOFETIL 500 MG PO TABS
500.0000 mg | ORAL_TABLET | Freq: Three times a day (TID) | ORAL | 0 refills | Status: AC
  Filled 2021-08-22: qty 270, 90d supply, fill #0

## 2021-08-26 ENCOUNTER — Other Ambulatory Visit: Payer: Self-pay

## 2021-08-26 ENCOUNTER — Encounter: Payer: Self-pay | Admitting: Orthopedic Surgery

## 2021-08-26 ENCOUNTER — Ambulatory Visit: Payer: PPO | Admitting: Orthopedic Surgery

## 2021-08-26 DIAGNOSIS — M7501 Adhesive capsulitis of right shoulder: Secondary | ICD-10-CM

## 2021-08-28 ENCOUNTER — Encounter (INDEPENDENT_AMBULATORY_CARE_PROVIDER_SITE_OTHER): Payer: Self-pay | Admitting: Ophthalmology

## 2021-08-28 ENCOUNTER — Ambulatory Visit (INDEPENDENT_AMBULATORY_CARE_PROVIDER_SITE_OTHER): Payer: PPO | Admitting: Ophthalmology

## 2021-08-28 ENCOUNTER — Other Ambulatory Visit: Payer: Self-pay

## 2021-08-28 DIAGNOSIS — H35033 Hypertensive retinopathy, bilateral: Secondary | ICD-10-CM | POA: Diagnosis not present

## 2021-08-28 DIAGNOSIS — I10 Essential (primary) hypertension: Secondary | ICD-10-CM

## 2021-08-28 DIAGNOSIS — H353111 Nonexudative age-related macular degeneration, right eye, early dry stage: Secondary | ICD-10-CM

## 2021-08-28 DIAGNOSIS — H353221 Exudative age-related macular degeneration, left eye, with active choroidal neovascularization: Secondary | ICD-10-CM | POA: Diagnosis not present

## 2021-08-28 DIAGNOSIS — H3581 Retinal edema: Secondary | ICD-10-CM

## 2021-08-28 DIAGNOSIS — Z961 Presence of intraocular lens: Secondary | ICD-10-CM | POA: Diagnosis not present

## 2021-08-29 ENCOUNTER — Encounter: Payer: Self-pay | Admitting: Orthopedic Surgery

## 2021-08-29 NOTE — Progress Notes (Signed)
Office Visit Note   Patient: Joanne Murray           Date of Birth: 1935/11/01           MRN: [id number] Visit Date: 08/26/2021 Requested by: Yvonna Alanis, NP [phone],  [place] [address] PCP: Yvonna Alanis, NP  Subjective: Chief Complaint  Patient presents with   Right Shoulder - Follow-up    HPI: Patient is an active 86 year old with right shoulder pain.  She had a subacromial injection performed 08/01/2021 which has diminished her pain.  The shot did help.  She is sleeping reasonably well at this time.  No recent falls or injuries and no prior surgeries on the right shoulder.  Denies any neck pain or radicular symptoms.  She is able to comb her hair at this time but still reports weakness and diminished function in the right shoulder.              ROS: All systems reviewed are negative as they relate to the chief complaint within the history of present illness.  Patient denies  fevers or chills.   Assessment & Plan: Visit Diagnoses:  1. Adhesive capsulitis of right shoulder     Plan: Impression is right shoulder pain with examination today more consistent with infraspinatus weakness and some coarse grinding with passive range of motion.  Concern at this time would be for rotator cuff pathology of the posterior superior rotator cuff.  Subscap strength looks good.  Lesser concern for adhesive capsulitis today than at the prior visit.  Plan MRI arthrogram to evaluate possible rotator cuff pathology.  Follow-up after that study.  Currently this appears to be a level of symptoms that Shaniquia can live with.  We will see what the scan shows in terms of amount of rotator cuff pathology present.  Follow-Up Instructions: Return for after MRI.   Orders:  Orders Placed This Encounter  Procedures   MR SHOULDER RIGHT W CONTRAST   Arthrogram   No orders of the defined types were placed in this encounter.     Procedures: No procedures performed   Clinical Data: No  additional findings.  Objective: Vital Signs: There were no vitals taken for this visit.  Physical Exam:   Constitutional: Patient appears well-developed HEENT:  Head: Normocephalic Eyes:EOM are normal Neck: Normal range of motion Cardiovascular: Normal rate Pulmonary/chest: Effort normal Neurologic: Patient is alert Skin: Skin is warm Psychiatric: Patient has normal mood and affect   Ortho Exam: Ortho exam demonstrates good cervical spine range of motion.  She has more crepitus on exam today than prior visit with internal/external rotation of the right arm at 90 degrees of abduction.  Infraspinatus strength on the right is also 5- out of 5 on the right compared to 5+ out of 5 on the left.  No Popeye deformity present.  Subscap strength 5+ out of 5 bilaterally.  No asymmetric restriction of external rotation at 15 degrees and AB duction.  No discrete AC joint tenderness.  Specialty Comments:  No specialty comments available.  Imaging: OCT, Retina - OU - Both Eyes  Result Date: 08/28/2021 Right Eye Quality was good. Central Foveal Thickness: 228. Progression has been stable. Findings include no SRF, no IRF, normal foveal contour, retinal drusen . Left Eye Quality was good. Central Foveal Thickness: 133. Progression has improved. Findings include normal foveal contour, subretinal fluid, no IRF, pigment epithelial detachment, retinal drusen , outer retinal atrophy (Mild interval improvement in SRF SN  macula). Notes *Images captured and stored on drive Diagnosis / Impression: OD: Non exudative ARMD -- NFP; no IRF/no SRF OS: Exudative ARMD vs CSR -- Mild interval improvement in SRF SN macula Clinical management: See below Abbreviations: NFP - Normal foveal profile. CME - cystoid macular edema. PED - pigment epithelial detachment. IRF - intraretinal fluid. SRF - subretinal fluid. EZ - ellipsoid zone. ERM - epiretinal membrane. ORA - outer retinal atrophy. ORT - outer retinal tubulation. SRHM -  subretinal hyper-reflective material. IRHM - intraretinal hyper-reflective material     PMFS History: Patient Active Problem List   Diagnosis Date Noted   Essential hypertension 03/19/2020   Nocturnal leg cramps 03/19/2020   Left-sided tinnitus 03/19/2020   Hair loss 03/19/2020   Depression, major, single episode, moderate (HCC) 03/19/2020   GERD (gastroesophageal reflux disease) 01/31/2020   Osteoporosis 01/31/2020   Thalassemia 01/31/2020   Prediabetes 01/31/2020   Skin lesion 01/31/2020   Nail problem 01/31/2020   Screening for condition 01/31/2020   Grief reaction 01/31/2020   Past Medical History:  Diagnosis Date   GERD (gastroesophageal reflux disease)    Grief reaction    Guttate hypomelanosis    Hypertension    Hypertensive retinopathy    Macular degeneration    Onychomycosis    Osteoporosis    Ovarian mass    Prediabetes    Rotator cuff syndrome    Thalassanemia     Family History  Problem Relation Age of Onset   Liver disease Father 56    Past Surgical History:  Procedure Laterality Date   CATARACT EXTRACTION     CATARACT EXTRACTION, BILATERAL Bilateral 2021   EYE SURGERY     TOOTH EXTRACTION  01/07/2021   2 teeth pulled    UVULA MASS EXCISION  12/20/2014   [facility]    Social History   Occupational History   Not on file  Tobacco Use   Smoking status: Never   Smokeless tobacco: Never  Vaping Use   Vaping Use: Never used  Substance and Sexual Activity   Alcohol use: Never   Drug use: Never   Sexual activity: Not Currently

## 2021-08-31 ENCOUNTER — Encounter (INDEPENDENT_AMBULATORY_CARE_PROVIDER_SITE_OTHER): Payer: Self-pay | Admitting: Ophthalmology

## 2021-09-13 ENCOUNTER — Telehealth: Payer: Self-pay | Admitting: *Deleted

## 2021-09-13 NOTE — Telephone Encounter (Signed)
Pt daughter called stating she is scheduled to get MRI arthogram done and pt does not want to be injected, wants to know if we can change the order to without contrast?

## 2021-09-16 ENCOUNTER — Other Ambulatory Visit: Payer: PPO

## 2021-09-16 ENCOUNTER — Inpatient Hospital Stay: Admission: RE | Admit: 2021-09-16 | Payer: PPO | Source: Ambulatory Visit

## 2021-09-16 NOTE — Telephone Encounter (Signed)
Left message on daughter vm stating notifying her of the change and to contact imaging to scheduel appt

## 2021-09-18 ENCOUNTER — Encounter (INDEPENDENT_AMBULATORY_CARE_PROVIDER_SITE_OTHER): Payer: PPO | Admitting: Ophthalmology

## 2021-09-18 ENCOUNTER — Ambulatory Visit: Payer: PPO | Admitting: Orthopedic Surgery

## 2021-09-19 NOTE — Progress Notes (Signed)
[facility] Note  09/25/2021     CHIEF COMPLAINT Patient presents for Retina Follow Up   HISTORY OF PRESENT ILLNESS: Joanne Murray is a 86 y.o. female who presents to the clinic today for:   HPI     Retina Follow Up   Patient presents with  Wet AMD.  In left eye.  This started 4 weeks ago.  I, the attending physician,  performed the HPI with the patient and updated documentation appropriately.        Comments   Patient here for 4 weeks retina follow up for exu ARMD OS. Patient states vision doing good. No eye pain.       Last edited by Bernarda Caffey, MD on 09/25/2021  4:06 PM.    Pts daughter states pt has tapered off her steroid, but is still on CellCept  Referring physician: Yvonna Alanis, NP [address],  [place] [address]  HISTORICAL INFORMATION:   Selected notes from the MEDICAL RECORD NUMBER Referred by Dr. Ellie Lunch for concern of exudative ARMD OS   CURRENT MEDICATIONS: No current outpatient medications on file. (Ophthalmic Drugs)   No current facility-administered medications for this visit. (Ophthalmic Drugs)   Current Outpatient Medications (Other)  Medication Sig   amLODipine (NORVASC) 5 MG tablet Take 1 tablet (5 mg total) by mouth daily.   CALCIUM PO Take by mouth.   clobetasol cream (TEMOVATE) 6.04 % Apply 1 application topically 2 (two) times daily as needed.   Glucosamine-Chondroit-Calcium (TRIPLE FLEX BONE & JOINT PO) Take 50 mg by mouth.   MAGNESIUM PO Take by mouth.   mycophenolate (CELLCEPT) 500 MG tablet Take 1 tablet (500 mg total) by mouth 3 (three) times daily with meals.   mycophenolate (CELLCEPT) 500 MG tablet Take 1 tablet (500 mg total) by mouth 3 (three) times daily with meals.   tacrolimus (PROTOPIC) 0.1 % ointment Apply to dark spots on the left thigh twice a day.   triamcinolone cream (KENALOG) 0.1 % Apply 1 application topically 2 (two) times daily as needed.   VITAMIN D PO Take 3,000 Units  by mouth daily.   ondansetron (ZOFRAN) 4 MG tablet Take 1 tablet (4 mg total) by mouth every 6 (six) hours. (Patient not taking: Reported on 09/25/2021)   predniSONE (DELTASONE) 10 MG tablet Take 10 mg by mouth daily with breakfast. (Patient not taking: Reported on 09/25/2021)   predniSONE (DELTASONE) 2.5 MG tablet Take 2 tablets by mouth (5mg ) daily 30 minutes after waking for 1 week, Then, decrease to 1 tablet by mouth (2.5mg ) daily 30 minutes after waking for 4 weeks. Then, stop. (Patient not taking: Reported on 09/25/2021)   No current facility-administered medications for this visit. (Other)   REVIEW OF SYSTEMS: ROS   Positive for: Gastrointestinal, Musculoskeletal, Eyes Negative for: Constitutional, Neurological, Skin, Genitourinary, HENT, Endocrine, Cardiovascular, Respiratory, Psychiatric, Allergic/Imm, Heme/Lymph Last edited by Theodore Demark, COA on 09/25/2021  8:54 AM.     ALLERGIES Allergies  Allergen Reactions   Lisinopril-Hydrochlorothiazide    PAST MEDICAL HISTORY Past Medical History:  Diagnosis Date   GERD (gastroesophageal reflux disease)    Grief reaction    Guttate hypomelanosis    Hypertension    Hypertensive retinopathy    Macular degeneration    Onychomycosis    Osteoporosis    Ovarian mass    Prediabetes    Rotator cuff syndrome    Thalassanemia    Past Surgical History:  Procedure Laterality Date  CATARACT EXTRACTION     CATARACT EXTRACTION, BILATERAL Bilateral 2021   EYE SURGERY     TOOTH EXTRACTION  01/07/2021   2 teeth pulled    UVULA MASS EXCISION  12/20/2014   [facility]     FAMILY HISTORY Family History  Problem Relation Age of Onset   Liver disease Father 29    SOCIAL HISTORY Social History   Tobacco Use   Smoking status: Never   Smokeless tobacco: Never  Vaping Use   Vaping Use: Never used  Substance Use Topics   Alcohol use: Never   Drug use: Never       OPHTHALMIC EXAM: Base Eye Exam     Visual Acuity  (Snellen - Linear)       Right Left   Dist [place] 20/50 -1 20/80 -1   Dist ph [place] 20/30 20/70 -2  Patient didn't bring glasses.        Tonometry (Tonopen, 8:51 AM)       Right Left   Pressure 15 14         Pupils       Dark Light Shape React APD   Right 2 1.5 Round Brisk None   Left 2 1.5 Round Brisk None         Visual Fields       Left Right   Restrictions Partial outer inferior temporal deficiency Partial outer inferior nasal deficiency         Extraocular Movement       Right Left    Full, Ortho Full, Ortho         Neuro/Psych     Oriented x3: Yes   Mood/Affect: Normal         Dilation     Both eyes: 1.0% Mydriacyl, 2.5% Phenylephrine @ 8:51 AM           Slit Lamp and Fundus Exam     Slit Lamp Exam       Right Left   Lids/Lashes Dermatochalasis - upper lid, MGD, Ptosis Dermatochalasis - upper lid, MGD, Ptosis   Conjunctiva/Sclera Mild conjunctivochalasis, mild melanosis Mild conjunctivochalasis, mild melanosis, mild nasal and temporal pterygium   Cornea Mild Arcus Mild Arcus, mild nasal and temporal pterygium   Anterior Chamber Deep and quiet Deep and quiet   Iris Round and dilated Round and dilated   Lens PCIOL in good position PCIOL in good position, 1+ PCO   Anterior Vitreous Vitreous syneresis Vitreous syneresis, Posterior vitreous detachment         Fundus Exam       Right Left   Disc Mild pallor, Sharp rim, mild PPA Mild pallor, sharp rim   C/D Ratio 0.5 0.5   Macula Flat, Blunted foveal reflex, Drusen, RPE mottling, no heme or edema Blunted foveal reflex, drusen, RPE mottling, shallow SRF nasal macula following proximal termporal arcades - improving   Vessels mild attenuation, mild tortuousity Vascular attenuation, Copper wiring   Periphery Attached, Midzonal drusen, mild reticular degeneration, no heme Attached, Midzonal drusen, mild reticular degeneration, no heme           Refraction     Wearing Rx       Sphere  Cylinder Axis Add   Right -1.50 +1.00 062 +2.50   Left -1.00 +0.50 150 +2.50           IMAGING AND PROCEDURES  Imaging and Procedures for 09/25/2021  OCT, Retina - OU - Both Eyes  Right Eye Quality was good. Central Foveal Thickness: 232. Progression has been stable. Findings include no SRF, no IRF, normal foveal contour, retinal drusen .   Left Eye Quality was good. Central Foveal Thickness: 131. Progression has improved. Findings include normal foveal contour, subretinal fluid, no IRF, pigment epithelial detachment, retinal drusen , outer retinal atrophy (Mild interval improvement in SRF SN macula).   Notes *Images captured and stored on drive  Diagnosis / Impression:  OD: Non exudative ARMD -- NFP; no IRF/no SRF OS: Exudative ARMD vs CSR -- Mild interval improvement in SRF SN macula  Clinical management:  See below  Abbreviations: NFP - Normal foveal profile. CME - cystoid macular edema. PED - pigment epithelial detachment. IRF - intraretinal fluid. SRF - subretinal fluid. EZ - ellipsoid zone. ERM - epiretinal membrane. ORA - outer retinal atrophy. ORT - outer retinal tubulation. SRHM - subretinal hyper-reflective material. IRHM - intraretinal hyper-reflective material            ASSESSMENT/PLAN:    ICD-10-CM   1. Exudative age-related macular degeneration of left eye with active choroidal neovascularization ([place])  H35.3221     2. Retinal edema  H35.81 OCT, Retina - OU - Both Eyes    3. 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 Essential hypertension  I10     5. Hypertensive retinopathy of both eyes  H35.033     6. Pseudophakia of both eyes  Z96.1      1,2. Exudative age related macular degeneration OS - referred by Dr. Ellie Lunch for decreased vision and new SRF OS - pt reports gradual decline in vision OS over several months             - BCVA OS: 20/70 from 20/80  - OCT OS shows: Central ORA and foveal  thinning; shallow SRF nasal macula and along temporal arcades -- non central and slightly improved  - FA 8.15.22 shows peripapillary CNV OS w/ mild leakage  - discussed most likely cause of decreased vision is central ORA rather than shallow peripheral SRF             - discussed possibility of a CSCR component contributing to SRF -- pt currently on po prednisone for bullous pemphigoid. (Pt was on loading dose of 40mg  daily, PO + CellCept--managed by Dermatology, Dr. Ronalee Red)  - prednisone now tapered off and CellCept dosing increased  - recommend holding off on injection for now due to improving fluid  - f/u 6-8 weeks, DFE, OCT  3. Age related macular degeneration, non-exudative, OD  - The incidence, anatomy, and pathology of dry AMD, risk of progression, and the AREDS and AREDS 2 study including smoking risks discussed with patient.             - OCT shows: OD: NFP; no IRF/no SRF             - BCVA OD 20/30 -- stable  - Recommend amsler grid monitoring  4,5. Hypertensive retinopathy OU - discussed importance of tight BP control - monitor   6. Pseudophakia OU  - s/p CE/IOL OU  - IOL in good position, doing well  - OS with mild PCO  - monitor  Ophthalmic Meds Ordered this visit:  No orders of the defined types were placed in this encounter.    Return for f/u 6-8 weeks, exu ARMD OS, DFE, OCT.  There are no Patient Instructions on file for this visit.  This document serves as a record of services personally performed by Gardiner Sleeper, MD, PhD. It was created on their behalf by Orvan Falconer, an ophthalmic technician. The creation of this record is the provider's dictation and/or activities during the visit.    Electronically signed by: Orvan Falconer, OA, 09/25/21  4:16 PM  This document serves as a record of services personally performed by Gardiner Sleeper, MD, PhD. It was created on their behalf by San Jetty. Owens Shark, OA an ophthalmic technician. The creation of this record is  the provider's dictation and/or activities during the visit.    Electronically signed by: San Jetty. Owens Shark, [place] 11.23.2022 4:16 PM  Gardiner Sleeper, M.D., Ph.D. Diseases & Surgery of the Retina and Vitreous [facility]  I have reviewed the above documentation for accuracy and completeness, and I agree with the above. Gardiner Sleeper, M.D., Ph.D. 09/25/21 4:16 PM   Abbreviations: M myopia (nearsighted); A astigmatism; H hyperopia (farsighted); P presbyopia; Mrx spectacle prescription;  CTL contact lenses; OD right eye; OS left eye; OU both eyes  XT exotropia; ET esotropia; PEK punctate epithelial keratitis; PEE punctate epithelial erosions; DES dry eye syndrome; MGD meibomian gland dysfunction; ATs artificial tears; PFAT's preservative free artificial tears; [place] nuclear sclerotic cataract; PSC posterior subcapsular cataract; ERM epi-retinal membrane; PVD posterior vitreous detachment; RD retinal detachment; DM diabetes mellitus; DR diabetic retinopathy; NPDR non-proliferative diabetic retinopathy; PDR proliferative diabetic retinopathy; CSME clinically significant macular edema; DME diabetic macular edema; dbh dot blot hemorrhages; CWS cotton wool spot; POAG primary open angle glaucoma; C/D cup-to-disc ratio; HVF humphrey visual field; GVF goldmann visual field; OCT optical coherence tomography; IOP intraocular pressure; BRVO Branch retinal vein occlusion; CRVO central retinal vein occlusion; CRAO central retinal artery occlusion; BRAO branch retinal artery occlusion; RT retinal tear; SB scleral buckle; PPV pars plana vitrectomy; VH Vitreous hemorrhage; PRP panretinal laser photocoagulation; IVK intravitreal kenalog; VMT vitreomacular traction; MH Macular hole;  NVD neovascularization of the disc; NVE neovascularization elsewhere; AREDS age related eye disease study; ARMD age related macular degeneration; POAG primary open angle glaucoma; EBMD epithelial/anterior basement membrane  dystrophy; ACIOL anterior chamber intraocular lens; IOL intraocular lens; PCIOL posterior chamber intraocular lens; Phaco/IOL phacoemulsification with intraocular lens placement; [place] photorefractive keratectomy; LASIK laser assisted in situ keratomileusis; HTN hypertension; DM diabetes mellitus; COPD chronic obstructive pulmonary disease

## 2021-09-25 ENCOUNTER — Encounter (INDEPENDENT_AMBULATORY_CARE_PROVIDER_SITE_OTHER): Payer: Self-pay | Admitting: Ophthalmology

## 2021-09-25 ENCOUNTER — Other Ambulatory Visit: Payer: Self-pay

## 2021-09-25 ENCOUNTER — Ambulatory Visit (INDEPENDENT_AMBULATORY_CARE_PROVIDER_SITE_OTHER): Payer: PPO | Admitting: Ophthalmology

## 2021-09-25 DIAGNOSIS — H3581 Retinal edema: Secondary | ICD-10-CM

## 2021-09-25 DIAGNOSIS — H353221 Exudative age-related macular degeneration, left eye, with active choroidal neovascularization: Secondary | ICD-10-CM | POA: Diagnosis not present

## 2021-09-25 DIAGNOSIS — H353111 Nonexudative age-related macular degeneration, right eye, early dry stage: Secondary | ICD-10-CM

## 2021-09-25 DIAGNOSIS — I10 Essential (primary) hypertension: Secondary | ICD-10-CM | POA: Diagnosis not present

## 2021-09-25 DIAGNOSIS — H35033 Hypertensive retinopathy, bilateral: Secondary | ICD-10-CM | POA: Diagnosis not present

## 2021-09-25 DIAGNOSIS — Z961 Presence of intraocular lens: Secondary | ICD-10-CM | POA: Diagnosis not present

## 2021-10-02 ENCOUNTER — Other Ambulatory Visit (HOSPITAL_COMMUNITY): Payer: Self-pay

## 2021-10-10 ENCOUNTER — Ambulatory Visit: Payer: PPO | Admitting: Orthopedic Surgery

## 2021-10-31 ENCOUNTER — Ambulatory Visit: Payer: PPO | Admitting: Orthopedic Surgery

## 2021-11-06 ENCOUNTER — Encounter (INDEPENDENT_AMBULATORY_CARE_PROVIDER_SITE_OTHER): Payer: PPO | Admitting: Ophthalmology

## 2021-11-08 DIAGNOSIS — L120 Bullous pemphigoid: Secondary | ICD-10-CM | POA: Diagnosis not present

## 2021-11-08 DIAGNOSIS — I10 Essential (primary) hypertension: Secondary | ICD-10-CM | POA: Diagnosis not present

## 2021-11-08 DIAGNOSIS — R7303 Prediabetes: Secondary | ICD-10-CM | POA: Diagnosis not present

## 2021-11-08 DIAGNOSIS — M810 Age-related osteoporosis without current pathological fracture: Secondary | ICD-10-CM | POA: Diagnosis not present

## 2021-11-14 NOTE — Progress Notes (Signed)
[facility] Note  11/20/2021     CHIEF COMPLAINT Patient presents for Retina Follow Up    HISTORY OF PRESENT ILLNESS: Joanne Murray is a 87 y.o. female who presents to the clinic today for:   HPI     Retina Follow Up   Patient presents with  Wet AMD.  In left eye.  Severity is moderate.  Duration of 7 weeks.  Since onset it is stable.  I, the attending physician,  performed the HPI with the patient and updated documentation appropriately.        Comments   Patient states vision the same OU.      Last edited by Bernarda Caffey, MD on 11/22/2021 12:28 AM.    Patient states OS has decreased since last visit. She is not seeing as well at a distance.   Referring physician: Yvonna Alanis, NP [phone],  [place] [address]  HISTORICAL INFORMATION:   Selected notes from the MEDICAL RECORD NUMBER Referred by Dr. Ellie Lunch for concern of exudative ARMD OS   CURRENT MEDICATIONS: No current outpatient medications on file. (Ophthalmic Drugs)   No current facility-administered medications for this visit. (Ophthalmic Drugs)   Current Outpatient Medications (Other)  Medication Sig   amLODipine (NORVASC) 5 MG tablet Take 1 tablet (5 mg total) by mouth daily.   CALCIUM PO Take by mouth.   clobetasol cream (TEMOVATE) 0.73 % Apply 1 application topically 2 (two) times daily as needed.   Glucosamine-Chondroit-Calcium (TRIPLE FLEX BONE & JOINT PO) Take 50 mg by mouth.   MAGNESIUM PO Take by mouth.   mycophenolate (CELLCEPT) 500 MG tablet Take 1 tablet (500 mg total) by mouth 3 (three) times daily with meals.   mycophenolate (CELLCEPT) 500 MG tablet Take 1 tablet (500 mg total) by mouth 3 (three) times daily with meals.   tacrolimus (PROTOPIC) 0.1 % ointment Apply to dark spots on the left thigh twice a day.   triamcinolone cream (KENALOG) 0.1 % Apply 1 application topically 2 (two) times daily as needed.   VITAMIN D PO Take 3,000 Units by mouth  daily.   ondansetron (ZOFRAN) 4 MG tablet Take 1 tablet (4 mg total) by mouth every 6 (six) hours. (Patient not taking: Reported on 09/25/2021)   predniSONE (DELTASONE) 10 MG tablet Take 10 mg by mouth daily with breakfast. (Patient not taking: Reported on 09/25/2021)   No current facility-administered medications for this visit. (Other)   REVIEW OF SYSTEMS: ROS   Positive for: Gastrointestinal, Musculoskeletal, Eyes Negative for: Constitutional, Neurological, Skin, Genitourinary, HENT, Endocrine, Cardiovascular, Respiratory, Psychiatric, Allergic/Imm, Heme/Lymph Last edited by Roselee Nova D, COT on 11/20/2021  9:18 AM.     ALLERGIES Allergies  Allergen Reactions   Lisinopril-Hydrochlorothiazide    PAST MEDICAL HISTORY Past Medical History:  Diagnosis Date   GERD (gastroesophageal reflux disease)    Grief reaction    Guttate hypomelanosis    Hypertension    Hypertensive retinopathy    Macular degeneration    Onychomycosis    Osteoporosis    Ovarian mass    Prediabetes    Rotator cuff syndrome    Thalassanemia    Past Surgical History:  Procedure Laterality Date   CATARACT EXTRACTION     CATARACT EXTRACTION, BILATERAL Bilateral 2021   EYE SURGERY     TOOTH EXTRACTION  01/07/2021   2 teeth pulled    UVULA MASS EXCISION  12/20/2014   [place]  HISTORY Family History  Problem Relation Age of Onset   Liver disease Father 37    SOCIAL HISTORY Social History   Tobacco Use   Smoking status: Never   Smokeless tobacco: Never  Vaping Use   Vaping Use: Never used  Substance Use Topics   Alcohol use: Never   Drug use: Never       OPHTHALMIC EXAM: Base Eye Exam     Visual Acuity (Snellen - Linear)       Right Left   Dist cc 20/25 +1 20/80   Dist ph cc NI 20/60 -2    Correction: Glasses         Tonometry (Tonopen, 9:15 AM)       Right Left   Pressure 17 17         Pupils       Dark Light Shape React APD   Right 2 1.5 Round  Minimal None   Left 2 1.5 Round Minimal None         Visual Fields (Counting fingers)       Left Right   Restrictions Partial outer inferior temporal deficiency Partial outer inferior nasal deficiency         Extraocular Movement       Right Left    Full, Ortho Full, Ortho         Neuro/Psych     Oriented x3: Yes   Mood/Affect: Normal         Dilation     Both eyes: 1.0% Mydriacyl, 2.5% Phenylephrine @ 9:15 AM           Slit Lamp and Fundus Exam     Slit Lamp Exam       Right Left   Lids/Lashes Dermatochalasis - upper lid, MGD, Ptosis Dermatochalasis - upper lid, MGD, Ptosis   Conjunctiva/Sclera Mild conjunctivochalasis, mild melanosis Mild conjunctivochalasis, mild melanosis, mild nasal and temporal pterygium   Cornea Mild Arcus, trace PEE Mild Arcus, mild nasal and temporal pterygium, trace PEE   Anterior Chamber Deep and quiet Deep and quiet   Iris Round and dilated Round and dilated   Lens PCIOL in good position PCIOL in good position, 1+ PCO   Anterior Vitreous Vitreous syneresis Vitreous syneresis, Posterior vitreous detachment         Fundus Exam       Right Left   Disc Mild pallor, Sharp rim, mild PPA Mild pallor, sharp rim   C/D Ratio 0.5 0.5   Macula Flat, Blunted foveal reflex, Drusen, RPE mottling, no heme or edema Flat, Blunted foveal reflex, drusen, RPE mottling, shallow SRF nasal macula following proximal termporal arcades - persistent, no heme   Vessels mild attenuation, mild tortuousity Vascular attenuation, Copper wiring, Tortuous   Periphery Attached, Midzonal drusen, mild reticular degeneration, no heme Attached, Midzonal drusen, mild reticular degeneration, no heme           IMAGING AND PROCEDURES  Imaging and Procedures for 11/20/2021  OCT, Retina - OU - Both Eyes       Right Eye Quality was good. Central Foveal Thickness: 230. Progression has been stable. Findings include no SRF, no IRF, normal foveal contour, retinal  drusen .   Left Eye Quality was good. Central Foveal Thickness: 126. Progression has worsened. Findings include normal foveal contour, subretinal fluid, no IRF, pigment epithelial detachment, retinal drusen , outer retinal atrophy (Mild interval increase in shallow SRF peripheral macula).   Notes *Images captured and stored on drive  Diagnosis /  Impression:  OD: Non exudative ARMD -- NFP; no IRF/no SRF OS: Exudative ARMD vs CSR -- Mild interval increase in shallow SRF peripheral macula  Clinical management:  See below  Abbreviations: NFP - Normal foveal profile. CME - cystoid macular edema. PED - pigment epithelial detachment. IRF - intraretinal fluid. SRF - subretinal fluid. EZ - ellipsoid zone. ERM - epiretinal membrane. ORA - outer retinal atrophy. ORT - outer retinal tubulation. SRHM - subretinal hyper-reflective material. IRHM - intraretinal hyper-reflective material            ASSESSMENT/PLAN:    ICD-10-CM   1. Exudative age-related macular degeneration of left eye with active choroidal neovascularization ([facility])  H35.3221 OCT, Retina - OU - Both Eyes    2. 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 Essential hypertension  I10     4. Hypertensive retinopathy of both eyes  H35.033     5. Pseudophakia of both eyes  Z96.1      1. Exudative age related macular degeneration OS - referred by Dr. Ellie Lunch for decreased vision and new SRF OS - pt reports gradual decline in vision OS over several months             - BCVA OS: 20/60 -- improved from 20/70  - OCT OS shows: Mild interval increase in shallow SRF peripheral macula  - FA 8.15.22 shows peripapillary CNV OS w/ mild leakage  - discussed most likely cause of decreased vision is central ORA rather than shallow peripheral SRF             - discussed possibility of a CSCR component contributing to SRF -- pt currently on po prednisone for bullous pemphigoid. (Pt was on loading  dose of 40mg  daily, PO + CellCept--managed by Dermatology, Dr. Ronalee Red)  - prednisone now tapered off and CellCept dosing increased  - pt would likely benefit from anti-VEGF therapy, but pt is going on a trip through March and wishes to defer tx at this time  - f/u Mar/April 2023 -- DFE/OCT, possible injection  2. Age related macular degeneration, non-exudative, OD  - The incidence, anatomy, and pathology of dry AMD, risk of progression, and the AREDS and AREDS 2 study including smoking risks discussed with patient.             - OCT shows: OD: NFP; no IRF/no SRF             - BCVA OD 20/25 -- improved  - Recommend amsler grid monitoring  3,4. Hypertensive retinopathy OU - discussed importance of tight BP control - monitor   5. Pseudophakia OU  - s/p CE/IOL OU  - IOL in good position, doing well  - OS with mild PCO  - monitor  Ophthalmic Meds Ordered this visit:  No orders of the defined types were placed in this encounter.     Return f/u April 2023, for DFE, OCT, possible injection.  There are no Patient Instructions on file for this visit.  This document serves as a record of services personally performed by Gardiner Sleeper, MD, PhD. It was created on their behalf by Orvan Falconer, an ophthalmic technician. The creation of this record is the provider's dictation and/or activities during the visit.    Electronically signed by: Orvan Falconer, OA, 11/22/21  12:30 AM    Gardiner Sleeper, M.D., Ph.D. Diseases & Surgery of the Retina and [place]  I have reviewed the above documentation for accuracy and completeness, and I agree with the above. Gardiner Sleeper, M.D., Ph.D. 09/25/21 12:30 AM   Abbreviations: M myopia (nearsighted); A astigmatism; H hyperopia (farsighted); P presbyopia; Mrx spectacle prescription;  CTL contact lenses; OD right eye; OS left eye; OU both eyes  XT exotropia; ET esotropia; PEK punctate epithelial keratitis; PEE  punctate epithelial erosions; DES dry eye syndrome; MGD meibomian gland dysfunction; ATs artificial tears; PFAT's preservative free artificial tears; [place] nuclear sclerotic cataract; PSC posterior subcapsular cataract; ERM epi-retinal membrane; PVD posterior vitreous detachment; RD retinal detachment; DM diabetes mellitus; DR diabetic retinopathy; NPDR non-proliferative diabetic retinopathy; PDR proliferative diabetic retinopathy; CSME clinically significant macular edema; DME diabetic macular edema; dbh dot blot hemorrhages; CWS cotton wool spot; POAG primary open angle glaucoma; C/D cup-to-disc ratio; HVF humphrey visual field; GVF goldmann visual field; OCT optical coherence tomography; IOP intraocular pressure; BRVO Branch retinal vein occlusion; CRVO central retinal vein occlusion; CRAO central retinal artery occlusion; BRAO branch retinal artery occlusion; RT retinal tear; SB scleral buckle; PPV pars plana vitrectomy; VH Vitreous hemorrhage; PRP panretinal laser photocoagulation; IVK intravitreal kenalog; VMT vitreomacular traction; MH Macular hole;  NVD neovascularization of the disc; NVE neovascularization elsewhere; AREDS age related eye disease study; ARMD age related macular degeneration; POAG primary open angle glaucoma; EBMD epithelial/anterior basement membrane dystrophy; ACIOL anterior chamber intraocular lens; IOL intraocular lens; PCIOL posterior chamber intraocular lens; Phaco/IOL phacoemulsification with intraocular lens placement; [place] photorefractive keratectomy; LASIK laser assisted in situ keratomileusis; HTN hypertension; DM diabetes mellitus; COPD chronic obstructive pulmonary disease

## 2021-11-20 ENCOUNTER — Other Ambulatory Visit: Payer: Self-pay

## 2021-11-20 ENCOUNTER — Ambulatory Visit (INDEPENDENT_AMBULATORY_CARE_PROVIDER_SITE_OTHER): Payer: PPO | Admitting: Ophthalmology

## 2021-11-20 ENCOUNTER — Ambulatory Visit (INDEPENDENT_AMBULATORY_CARE_PROVIDER_SITE_OTHER): Payer: PPO

## 2021-11-20 ENCOUNTER — Encounter (INDEPENDENT_AMBULATORY_CARE_PROVIDER_SITE_OTHER): Payer: Self-pay | Admitting: Ophthalmology

## 2021-11-20 DIAGNOSIS — Z23 Encounter for immunization: Secondary | ICD-10-CM | POA: Diagnosis not present

## 2021-11-20 DIAGNOSIS — Z961 Presence of intraocular lens: Secondary | ICD-10-CM

## 2021-11-20 DIAGNOSIS — H353111 Nonexudative age-related macular degeneration, right eye, early dry stage: Secondary | ICD-10-CM

## 2021-11-20 DIAGNOSIS — H353221 Exudative age-related macular degeneration, left eye, with active choroidal neovascularization: Secondary | ICD-10-CM

## 2021-11-20 DIAGNOSIS — H35033 Hypertensive retinopathy, bilateral: Secondary | ICD-10-CM | POA: Diagnosis not present

## 2021-11-20 DIAGNOSIS — I10 Essential (primary) hypertension: Secondary | ICD-10-CM

## 2021-11-20 NOTE — Progress Notes (Signed)
° °  Covid-19 Vaccination Clinic  Name:  Joanne Murray    MRN: [id number] DOB: [birth year]  11/20/2021  [name] was observed post Covid-19 immunization for 15 minutes without incident. She was provided with Vaccine Information Sheet and instruction to access the V-Safe system.   [name] was instructed to call 911 with any severe reactions post vaccine: Difficulty breathing  Swelling of face and throat  A fast heartbeat  A bad rash all over body  Dizziness and weakness   Immunizations Administered     Name Date Dose VIS Date Route   Pfizer Covid-19 Vaccine Bivalent Booster 11/20/2021  4:18 PM 0.3 mL 07/03/2021 Intramuscular   Manufacturer: [place]   Lot: [id number]   [place]: [employer]

## 2021-11-22 ENCOUNTER — Encounter (INDEPENDENT_AMBULATORY_CARE_PROVIDER_SITE_OTHER): Payer: Self-pay | Admitting: Ophthalmology

## 2021-11-26 ENCOUNTER — Other Ambulatory Visit: Payer: Self-pay | Admitting: Internal Medicine

## 2021-11-26 DIAGNOSIS — M810 Age-related osteoporosis without current pathological fracture: Secondary | ICD-10-CM

## 2022-01-29 ENCOUNTER — Encounter (INDEPENDENT_AMBULATORY_CARE_PROVIDER_SITE_OTHER): Payer: PPO | Admitting: Ophthalmology

## 2022-01-29 NOTE — Progress Notes (Signed)
?[facility] Note ? ?02/03/2022 ? ?  ? ?CHIEF COMPLAINT ?Patient presents for Retina Follow Up ? ? ? ?HISTORY OF PRESENT ILLNESS: ?Joanne Murray is a 87 y.o. female who presents to the clinic today for:  ? ?HPI   ? ? Retina Follow Up   ?Patient presents with  Wet AMD.  In left eye.  Duration of 10 weeks.  Since onset it is gradually worsening.  I, the attending physician,  performed the HPI with the patient and updated documentation appropriately. ? ?  ?  ? ? Comments   ?10 1/2 week follow up Exu ARMD OS-  Patient thinks vision is worse OS. OS holds a lot of tears that does not spill out.  Itchy at times as well.  Denies using eye drops. ? ? ?  ?  ?Last edited by Joanne Caffey, MD on 02/03/2022  9:30 AM.  ?  ?Patient states OS has decreased since last visit. She is not seeing as well at a distance.  ? ?Referring physician: ?Joanne Mutter, MD ?[address] ?[place],  [name] ? ?HISTORICAL INFORMATION:  ? ?Selected notes from the [facility] ?Referred by Dr. Ellie Murray for concern of exudative ARMD OS  ? ?CURRENT MEDICATIONS: ?No current outpatient medications on file. (Ophthalmic Drugs)  ? ?No current facility-administered medications for this visit. (Ophthalmic Drugs)  ? ?Current Outpatient Medications (Other)  ?Medication Sig  ? amLODipine (NORVASC) 5 MG tablet Take 1 tablet (5 mg total) by mouth daily.  ? CALCIUM PO Take by mouth.  ? clobetasol cream (TEMOVATE) 1.60 % Apply 1 application topically 2 (two) times daily as needed.  ? Glucosamine-Chondroit-Calcium (TRIPLE FLEX BONE & JOINT PO) Take 50 mg by mouth.  ? MAGNESIUM PO Take by mouth.  ? mycophenolate (CELLCEPT) 500 MG tablet Take 1 tablet (500 mg total) by mouth 3 (three) times daily with meals.  ? tacrolimus (PROTOPIC) 0.1 % ointment Apply to dark spots on the left thigh twice a day.  ? triamcinolone cream (KENALOG) 0.1 % Apply 1 application topically 2 (two) times daily as needed.  ? VITAMIN D PO Take 3,000  Units by mouth daily.  ? mycophenolate (CELLCEPT) 500 MG tablet Take 1 tablet (500 mg total) by mouth 3 (three) times daily with meals.  ? ondansetron (ZOFRAN) 4 MG tablet Take 1 tablet (4 mg total) by mouth every 6 (six) hours. (Patient not taking: Reported on 09/25/2021)  ? predniSONE (DELTASONE) 10 MG tablet Take 10 mg by mouth daily with breakfast. (Patient not taking: Reported on 09/25/2021)  ? ?No current facility-administered medications for this visit. (Other)  ? ?REVIEW OF SYSTEMS: ?ROS   ?Positive for: Gastrointestinal, Musculoskeletal, Eyes ?Negative for: Constitutional, Neurological, Skin, Genitourinary, HENT, Endocrine, Cardiovascular, Respiratory, Psychiatric, Allergic/Imm, Heme/Lymph ?Last edited by Joanne Murray, COA on 02/03/2022  9:09 AM.  ?  ? ?ALLERGIES ?Allergies  ?Allergen Reactions  ? Lisinopril-Hydrochlorothiazide   ? ?PAST MEDICAL HISTORY ?Past Medical History:  ?Diagnosis Date  ? GERD (gastroesophageal reflux disease)   ? Grief reaction   ? Guttate hypomelanosis   ? Hypertension   ? Hypertensive retinopathy   ? Macular degeneration   ? Onychomycosis   ? Osteoporosis   ? Ovarian mass   ? Prediabetes   ? Rotator cuff syndrome   ? Thalassanemia   ? ?Past Surgical History:  ?Procedure Laterality Date  ? CATARACT EXTRACTION    ? CATARACT EXTRACTION, BILATERAL Bilateral 2021  ? EYE SURGERY    ?  TOOTH EXTRACTION  01/07/2021  ? 2 teeth pulled   ? UVULA MASS EXCISION  12/20/2014  ? [facility]   ? ?FAMILY HISTORY ?Family History  ?Problem Relation Age of Onset  ? Liver disease Father 6  ? ?SOCIAL HISTORY ?Social History  ? ?Tobacco Use  ? Smoking status: Never  ? Smokeless tobacco: Never  ?Vaping Use  ? Vaping Use: Never used  ?Substance Use Topics  ? Alcohol use: Never  ? Drug use: Never  ?  ? ?  ?OPHTHALMIC EXAM: ?Base Eye Exam   ? ? Visual Acuity (Snellen - Linear)   ? ?   Right Left  ? Dist cc 20/25 20/100  ? Dist ph cc  NI  ? ? Correction: Glasses  ? ?  ?  ? ? Tonometry (Tonopen, 9:21 AM)    ? ?   Right Left  ? Pressure 15 14  ? ?  ?  ? ? Pupils   ? ?   Dark Light Shape React APD  ? Right 2 1.5 Round Minimal None  ? Left 2 1.5 Round Minimal None  ? ?  ?  ? ? Visual Fields (Counting fingers)   ? ?   Left Right  ?  Full Full  ? ?  ?  ? ? Extraocular Movement   ? ?   Right Left  ?  Full Full  ? ?  ?  ? ? Neuro/Psych   ? ? Oriented x3: Yes  ? Mood/Affect: Normal  ? ?  ?  ? ? Dilation   ? ? Both eyes: 1.0% Mydriacyl, 2.5% Phenylephrine @ 9:21 AM  ? ?  ?  ? ?  ? ?Slit Lamp and Fundus Exam   ? ? Slit Lamp Exam   ? ?   Right Left  ? Lids/Lashes Dermatochalasis - upper lid, MGD, Ptosis Dermatochalasis - upper lid, MGD, Ptosis  ? Conjunctiva/Sclera Mild conjunctivochalasis, mild melanosis Mild conjunctivochalasis, mild melanosis, mild nasal and temporal pterygium  ? Cornea Mild Arcus, trace PEE Mild Arcus, mild nasal and temporal pterygium, trace PEE  ? Anterior Chamber Deep and quiet Deep and quiet  ? Iris Round and dilated Round and dilated  ? Lens PCIOL in good position PCIOL in good position, 1+ PCO  ? Anterior Vitreous Vitreous syneresis Vitreous syneresis, Posterior vitreous detachment  ? ?  ?  ? ? Fundus Exam   ? ?   Right Left  ? Disc Mild pallor, Sharp rim, mild PPA Mild pallor, sharp rim  ? C/D Ratio 0.5 0.5  ? Macula Flat, Blunted foveal reflex, Drusen, RPE mottling, no heme or edema Flat, Blunted foveal reflex, drusen, RPE mottling, shallow SRF nasal macula following proximal termporal arcades - persistent, no heme, patchy central atrophy  ? Vessels attenuated attenuated  ? Periphery Attached, Midzonal drusen, mild reticular degeneration, no heme Attached, Midzonal drusen, mild reticular degeneration, no heme  ? ?  ?  ? ?  ? ?Refraction   ? ? Wearing Rx   ? ?   Sphere Cylinder Axis Add  ? Right -1.50 +1.00 062 +2.50  ? Left -1.00 +0.50 150 +2.50  ? ?  ?  ? ? Manifest Refraction   ? ?   Sphere Cylinder Axis Dist VA  ? Right      ? Left -1.75 +0.50 150 20/80+1  ? ?  ?  ? ?  ? ?IMAGING AND PROCEDURES   ?Imaging and Procedures for 02/03/2022 ? ?OCT, Retina - OU -  Both Eyes   ? ?   ?Right Eye ?Quality was good. Central Foveal Thickness: 230. Progression has been stable. Findings include no SRF, no IRF, normal foveal contour, retinal drusen .  ? ?Left Eye ?Quality was good. Central Foveal Thickness: 123. Progression has been stable. Findings include normal foveal contour, subretinal fluid, no IRF, pigment epithelial detachment, retinal drusen , outer retinal atrophy (Persistent shallow SRF peripheral macula, persistent central ORA with SRHM).  ? ?Notes ?*Images captured and stored on drive ? ?Diagnosis / Impression:  ?OD: Non exudative ARMD -- NFP; no IRF/no SRF ?OS: Exudative ARMD vs CSR -- persistent shallow SRF peripheral macula--slightly improved, persistent central ORA with SRHM -- slightly thinner centrally ? ?Clinical management:  ?See below ? ?Abbreviations: NFP - Normal foveal profile. CME - cystoid macular edema. PED - pigment epithelial detachment. IRF - intraretinal fluid. SRF - subretinal fluid. EZ - ellipsoid zone. ERM - epiretinal membrane. ORA - outer retinal atrophy. ORT - outer retinal tubulation. SRHM - subretinal hyper-reflective material. IRHM - intraretinal hyper-reflective material ? ? ?  ? ?Intravitreal Injection, Pharmacologic Agent - OS - Left Eye   ? ?   ?Time Out ?02/03/2022. 10:13 AM. Confirmed correct patient, procedure, site, and patient consented.  ? ?Anesthesia ?Topical anesthesia was used. Anesthetic medications included Lidocaine 2%, Proparacaine 0.5%.  ? ?Procedure ?Preparation included 5% betadine to ocular surface, eyelid speculum. A supplied (32g) needle was used.  ? ?Injection: ?1.25 mg Bevacizumab 1.'25mg'$ /0.6m ?  Route: Intravitreal, Site: Left Eye ?  [place] [id number] Lot:: [id number] Expiration date: 03/12/2022  ? ?Post-op ?Post injection exam found visual acuity of at least counting fingers. The patient tolerated the procedure well. There were no complications. The patient  received written and verbal post procedure care education. Post injection medications were not given.  ? ?  ?  ?  ? ?  ?ASSESSMENT/PLAN: ? ?  ICD-10-CM   ?1. 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

## 2022-02-03 ENCOUNTER — Ambulatory Visit (INDEPENDENT_AMBULATORY_CARE_PROVIDER_SITE_OTHER): Payer: PPO | Admitting: Ophthalmology

## 2022-02-03 ENCOUNTER — Encounter (INDEPENDENT_AMBULATORY_CARE_PROVIDER_SITE_OTHER): Payer: Self-pay | Admitting: Ophthalmology

## 2022-02-03 DIAGNOSIS — H35033 Hypertensive retinopathy, bilateral: Secondary | ICD-10-CM | POA: Diagnosis not present

## 2022-02-03 DIAGNOSIS — Z961 Presence of intraocular lens: Secondary | ICD-10-CM

## 2022-02-03 DIAGNOSIS — H353111 Nonexudative age-related macular degeneration, right eye, early dry stage: Secondary | ICD-10-CM | POA: Diagnosis not present

## 2022-02-03 DIAGNOSIS — H353221 Exudative age-related macular degeneration, left eye, with active choroidal neovascularization: Secondary | ICD-10-CM | POA: Diagnosis not present

## 2022-02-03 DIAGNOSIS — I10 Essential (primary) hypertension: Secondary | ICD-10-CM

## 2022-02-03 MED ORDER — BEVACIZUMAB CHEMO INJECTION 1.25MG/0.05ML SYRINGE FOR KALEIDOSCOPE
1.2500 mg | INTRAVITREAL | Status: AC | PRN
  Administered 2022-02-03: 1.25 mg via INTRAVITREAL

## 2022-02-19 ENCOUNTER — Other Ambulatory Visit (HOSPITAL_COMMUNITY): Payer: Self-pay

## 2022-02-20 ENCOUNTER — Other Ambulatory Visit (HOSPITAL_COMMUNITY): Payer: Self-pay

## 2022-02-20 MED ORDER — MYCOPHENOLATE MOFETIL 500 MG PO TABS
500.0000 mg | ORAL_TABLET | Freq: Three times a day (TID) | ORAL | 0 refills | Status: AC
  Filled 2022-02-20: qty 270, 90d supply, fill #0
  Filled 2022-02-28: qty 90, 30d supply, fill #0
  Filled 2022-05-08 – 2022-05-22 (×2): qty 90, 30d supply, fill #1

## 2022-02-28 ENCOUNTER — Other Ambulatory Visit (HOSPITAL_COMMUNITY): Payer: Self-pay

## 2022-03-03 ENCOUNTER — Other Ambulatory Visit (HOSPITAL_COMMUNITY): Payer: Self-pay

## 2022-03-03 MED ORDER — AMLODIPINE BESYLATE 5 MG PO TABS
5.0000 mg | ORAL_TABLET | Freq: Every day | ORAL | 1 refills | Status: AC
  Filled 2022-03-03: qty 90, 90d supply, fill #0

## 2022-03-14 NOTE — Progress Notes (Signed)
?[facility] Note ? ?03/17/2022 ? ?  ? ?CHIEF COMPLAINT ?Patient presents for Retina Follow Up ? ? ? ?HISTORY OF PRESENT ILLNESS: ?Joanne Murray is a 87 y.o. female who presents to the clinic today for:  ? ?HPI   ? ? Retina Follow Up   ?Patient presents with  Wet AMD.  In left eye.  This started 5 weeks ago.  I, the attending physician,  performed the HPI with the patient and updated documentation appropriately. ? ?  ?  ? ? Comments   ?Patient here for 5 weeks retina follow up for exu ARMD OS. Patient states vision doing good.  ?No eye pain.  ? ?  ?  ?Last edited by Bernarda Caffey, MD on 03/17/2022 11:06 PM.  ?  ? ?Patient states vision is doing well, she had no problems after first injection ? ?Referring physician: ?No referring provider defined for this encounter. ? ?HISTORICAL INFORMATION:  ? ?Selected notes from the [facility] ?Referred by Dr. Ellie Lunch for concern of exudative ARMD OS  ? ?CURRENT MEDICATIONS: ?No current outpatient medications on file. (Ophthalmic Drugs)  ? ?No current facility-administered medications for this visit. (Ophthalmic Drugs)  ? ?Current Outpatient Medications (Other)  ?Medication Sig  ? amLODipine (NORVASC) 5 MG tablet Take 1 tablet (5 mg total) by mouth daily.  ? amLODipine (NORVASC) 5 MG tablet Take 1 tablet (5 mg total) by mouth daily.  ? CALCIUM PO Take by mouth.  ? clobetasol cream (TEMOVATE) 5.70 % Apply 1 application topically 2 (two) times daily as needed.  ? Glucosamine-Chondroit-Calcium (TRIPLE FLEX BONE & JOINT PO) Take 50 mg by mouth.  ? MAGNESIUM PO Take by mouth.  ? mycophenolate (CELLCEPT) 500 MG tablet Take 1 tablet (500 mg total) by mouth 3 (three) times daily with meals.  ? mycophenolate (CELLCEPT) 500 MG tablet Take 1 tablet (500 mg total) by mouth 3 (three) times daily with meals.  ? mycophenolate (CELLCEPT) 500 MG tablet Take 1 tablet (500 mg total) by mouth 3 (three) times daily with meals.  ? tacrolimus (PROTOPIC) 0.1 %  ointment Apply to dark spots on the left thigh twice a day.  ? triamcinolone cream (KENALOG) 0.1 % Apply 1 application topically 2 (two) times daily as needed.  ? VITAMIN D PO Take 3,000 Units by mouth daily.  ? ondansetron (ZOFRAN) 4 MG tablet Take 1 tablet (4 mg total) by mouth every 6 (six) hours. (Patient not taking: Reported on 09/25/2021)  ? predniSONE (DELTASONE) 10 MG tablet Take 10 mg by mouth daily with breakfast. (Patient not taking: Reported on 09/25/2021)  ? ?No current facility-administered medications for this visit. (Other)  ? ?REVIEW OF SYSTEMS: ?ROS   ?Positive for: Gastrointestinal, Musculoskeletal, Eyes ?Negative for: Constitutional, Neurological, Skin, Genitourinary, HENT, Endocrine, Cardiovascular, Respiratory, Psychiatric, Allergic/Imm, Heme/Lymph ?Last edited by Theodore Demark, COA on 03/17/2022  2:02 PM.  ?  ? ?ALLERGIES ?Allergies  ?Allergen Reactions  ? Lisinopril-Hydrochlorothiazide   ? ?PAST MEDICAL HISTORY ?Past Medical History:  ?Diagnosis Date  ? GERD (gastroesophageal reflux disease)   ? Grief reaction   ? Guttate hypomelanosis   ? Hypertension   ? Hypertensive retinopathy   ? Macular degeneration   ? Onychomycosis   ? Osteoporosis   ? Ovarian mass   ? Prediabetes   ? Rotator cuff syndrome   ? Thalassanemia   ? ?Past Surgical History:  ?Procedure Laterality Date  ? CATARACT EXTRACTION    ? CATARACT EXTRACTION, BILATERAL Bilateral  2021  ? EYE SURGERY    ? TOOTH EXTRACTION  01/07/2021  ? 2 teeth pulled   ? UVULA MASS EXCISION  12/20/2014  ? [facility]   ? ?FAMILY HISTORY ?Family History  ?Problem Relation Age of Onset  ? Liver disease Father 58  ? ?SOCIAL HISTORY ?Social History  ? ?Tobacco Use  ? Smoking status: Never  ? Smokeless tobacco: Never  ?Vaping Use  ? Vaping Use: Never used  ?Substance Use Topics  ? Alcohol use: Never  ? Drug use: Never  ?  ? ?  ?OPHTHALMIC EXAM: ?Base Eye Exam   ? ? Visual Acuity (Snellen - Linear)   ? ?   Right Left  ? Dist cc 20/20 -2 20/100  ?  Dist ph cc  NI  ? ? Correction: Glasses  ? ?  ?  ? ? Tonometry (Tonopen, 1:59 PM)   ? ?   Right Left  ? Pressure 13 13  ? ?  ?  ? ? Pupils   ? ?   Dark Light Shape React APD  ? Right 2 1.5 Round Minimal None  ? Left 2 1.5 Round Minimal None  ? ?  ?  ? ? Visual Fields (Counting fingers)   ? ?   Left Right  ?  Full Full  ? ?  ?  ? ? Extraocular Movement   ? ?   Right Left  ?  Full, Ortho Full, Ortho  ? ?  ?  ? ? Neuro/Psych   ? ? Oriented x3: Yes  ? Mood/Affect: Normal  ? ?  ?  ? ? Dilation   ? ? Both eyes: 1.0% Mydriacyl, 2.5% Phenylephrine @ 1:59 PM  ? ?  ?  ? ?  ? ?Slit Lamp and Fundus Exam   ? ? Slit Lamp Exam   ? ?   Right Left  ? Lids/Lashes Dermatochalasis - upper lid, MGD, Ptosis Dermatochalasis - upper lid, MGD, Ptosis  ? Conjunctiva/Sclera Mild conjunctivochalasis, mild melanosis Mild conjunctivochalasis, mild melanosis, mild nasal and temporal pterygium  ? Cornea Mild Arcus, trace PEE Mild Arcus, mild nasal and temporal pterygium, trace PEE  ? Anterior Chamber Deep and quiet Deep and quiet  ? Iris Round and dilated Round and dilated  ? Lens PCIOL in good position PCIOL in good position, 1+ PCO  ? Anterior Vitreous Vitreous syneresis Vitreous syneresis, Posterior vitreous detachment  ? ?  ?  ? ? Fundus Exam   ? ?   Right Left  ? Disc Mild pallor, Sharp rim, mild PPA Mild pallor, sharp rim  ? C/D Ratio 0.5 0.5  ? Macula Flat, Blunted foveal reflex, Drusen, RPE mottling, no heme or edema Flat, Blunted foveal reflex, drusen, RPE mottling, shallow SRF nasal macula following proximal termporal arcades - improved, no heme, patchy central atrophy  ? Vessels attenuated, Tortuous attenuated  ? Periphery Attached, Midzonal drusen, mild reticular degeneration, no heme Attached, Midzonal drusen, mild reticular degeneration, no heme  ? ?  ?  ? ?  ? ?Refraction   ? ? Wearing Rx   ? ?   Sphere Cylinder Axis Add  ? Right -1.50 +1.00 062 +2.50  ? Left -1.00 +0.50 150 +2.50  ? ?  ?  ? ?  ? ?IMAGING AND PROCEDURES  ?Imaging  and Procedures for 03/17/2022 ? ?OCT, Retina - OU - Both Eyes   ? ?   ?Right Eye ?Quality was good. Central Foveal Thickness: 228. Progression has been stable. Findings  include no SRF, no IRF, normal foveal contour, retinal drusen .  ? ?Left Eye ?Quality was good. Central Foveal Thickness: 120. Progression has improved. Findings include normal foveal contour, no IRF, pigment epithelial detachment, retinal drusen , outer retinal atrophy, no SRF (Interval improvement in shallow shallow SRF peripheral macula, persistent central ORA with SRHM).  ? ?Notes ?*Images captured and stored on drive ? ?Diagnosis / Impression:  ?OD: Non exudative ARMD -- NFP; no IRF/no SRF ?OS: Exudative ARMD vs CSR -- Interval improvement in shallow SRF peripheral macula, persistent central ORA with SRHM ? ?Clinical management:  ?See below ? ?Abbreviations: NFP - Normal foveal profile. CME - cystoid macular edema. PED - pigment epithelial detachment. IRF - intraretinal fluid. SRF - subretinal fluid. EZ - ellipsoid zone. ERM - epiretinal membrane. ORA - outer retinal atrophy. ORT - outer retinal tubulation. SRHM - subretinal hyper-reflective material. IRHM - intraretinal hyper-reflective material ? ? ?  ? ?Intravitreal Injection, Pharmacologic Agent - OS - Left Eye   ? ?   ?Time Out ?03/17/2022. 2:21 PM. Confirmed correct patient, procedure, site, and patient consented.  ? ?Anesthesia ?Topical anesthesia was used. Anesthetic medications included Lidocaine 2%, Proparacaine 0.5%.  ? ?Procedure ?Preparation included 5% betadine to ocular surface, eyelid speculum. A (32g) needle was used.  ? ?Injection: ?1.25 mg Bevacizumab 1.'25mg'$ /0.72m ?  Route: Intravitreal, Site: Left Eye ?  [place] [id number]:: [id number] Expiration date: 05/01/2022  ? ?Post-op ?Post injection exam found visual acuity of at least counting fingers. The patient tolerated the procedure well. There were no complications. The patient received written and verbal post procedure care  education. Post injection medications were not given.  ? ?  ?  ?  ? ?  ?ASSESSMENT/PLAN: ? ?  ICD-10-CM   ?1. Exudative age-related macular degeneration of left eye with active choroidal neovascularization ([place]

## 2022-03-17 ENCOUNTER — Encounter (INDEPENDENT_AMBULATORY_CARE_PROVIDER_SITE_OTHER): Payer: Self-pay | Admitting: Ophthalmology

## 2022-03-17 ENCOUNTER — Ambulatory Visit (INDEPENDENT_AMBULATORY_CARE_PROVIDER_SITE_OTHER): Payer: PPO | Admitting: Ophthalmology

## 2022-03-17 DIAGNOSIS — H35033 Hypertensive retinopathy, bilateral: Secondary | ICD-10-CM

## 2022-03-17 DIAGNOSIS — H353111 Nonexudative age-related macular degeneration, right eye, early dry stage: Secondary | ICD-10-CM | POA: Diagnosis not present

## 2022-03-17 DIAGNOSIS — H353221 Exudative age-related macular degeneration, left eye, with active choroidal neovascularization: Secondary | ICD-10-CM

## 2022-03-17 DIAGNOSIS — I10 Essential (primary) hypertension: Secondary | ICD-10-CM

## 2022-03-17 DIAGNOSIS — Z961 Presence of intraocular lens: Secondary | ICD-10-CM | POA: Diagnosis not present

## 2022-03-17 MED ORDER — BEVACIZUMAB CHEMO INJECTION 1.25MG/0.05ML SYRINGE FOR KALEIDOSCOPE
1.2500 mg | INTRAVITREAL | Status: AC | PRN
  Administered 2022-03-17: 1.25 mg via INTRAVITREAL

## 2022-04-11 NOTE — Progress Notes (Signed)
[facility] Note  04/14/2022     CHIEF COMPLAINT Patient presents for Retina Follow Up    HISTORY OF PRESENT ILLNESS: Joanne Murray is a 87 y.o. female who presents to the clinic today for:   HPI     Retina Follow Up   Patient presents with  Wet AMD.  In left eye.  Severity is moderate.  Duration of 4 weeks.  Since onset it is gradually worsening.  I, the attending physician,  performed the HPI with the patient and updated documentation appropriately.        Comments   Pt here for 4 wk ret f/u exu ARMD OS. Pt feels VA in OS is worse.       Last edited by Bernarda Caffey, MD on 04/14/2022  4:39 PM.    Patient states vision is about the same.  Having "white stuff" in eye.    Referring physician: No referring provider defined for this encounter.  HISTORICAL INFORMATION:   Selected notes from the MEDICAL RECORD NUMBER Referred by Dr. Ellie Lunch for concern of exudative ARMD OS   CURRENT MEDICATIONS: No current outpatient medications on file. (Ophthalmic Drugs)   No current facility-administered medications for this visit. (Ophthalmic Drugs)   Current Outpatient Medications (Other)  Medication Sig   amLODipine (NORVASC) 5 MG tablet Take 1 tablet (5 mg total) by mouth daily.   amLODipine (NORVASC) 5 MG tablet Take 1 tablet (5 mg total) by mouth daily.   CALCIUM PO Take by mouth.   clobetasol cream (TEMOVATE) 6.46 % Apply 1 application topically 2 (two) times daily as needed.   Glucosamine-Chondroit-Calcium (TRIPLE FLEX BONE & JOINT PO) Take 50 mg by mouth.   MAGNESIUM PO Take by mouth.   mycophenolate (CELLCEPT) 500 MG tablet Take 1 tablet (500 mg total) by mouth 3 (three) times daily with meals.   mycophenolate (CELLCEPT) 500 MG tablet Take 1 tablet (500 mg total) by mouth 3 (three) times daily with meals.   mycophenolate (CELLCEPT) 500 MG tablet Take 1 tablet (500 mg total) by mouth 3 (three) times daily with meals.   tacrolimus (PROTOPIC)  0.1 % ointment Apply to dark spots on the left thigh twice a day.   triamcinolone cream (KENALOG) 0.1 % Apply 1 application topically 2 (two) times daily as needed.   VITAMIN D PO Take 3,000 Units by mouth daily.   ondansetron (ZOFRAN) 4 MG tablet Take 1 tablet (4 mg total) by mouth every 6 (six) hours. (Patient not taking: Reported on 09/25/2021)   predniSONE (DELTASONE) 10 MG tablet Take 10 mg by mouth daily with breakfast. (Patient not taking: Reported on 09/25/2021)   No current facility-administered medications for this visit. (Other)   REVIEW OF SYSTEMS: ROS   Positive for: Gastrointestinal, Musculoskeletal, Eyes Negative for: Constitutional, Neurological, Skin, Genitourinary, HENT, Endocrine, Cardiovascular, Respiratory, Psychiatric, Allergic/Imm, Heme/Lymph Last edited by Kingsley Spittle, COT on 04/14/2022  1:25 PM.     ALLERGIES Allergies  Allergen Reactions   Lisinopril-Hydrochlorothiazide    PAST MEDICAL HISTORY Past Medical History:  Diagnosis Date   GERD (gastroesophageal reflux disease)    Grief reaction    Guttate hypomelanosis    Hypertension    Hypertensive retinopathy    Macular degeneration    Onychomycosis    Osteoporosis    Ovarian mass    Prediabetes    Rotator cuff syndrome    Thalassanemia    Past Surgical History:  Procedure Laterality Date   CATARACT EXTRACTION  CATARACT EXTRACTION, BILATERAL Bilateral 2021   EYE SURGERY     TOOTH EXTRACTION  01/07/2021   2 teeth pulled    UVULA MASS EXCISION  12/20/2014   [facility]    FAMILY HISTORY Family History  Problem Relation Age of Onset   Liver disease Father 50   SOCIAL HISTORY Social History   Tobacco Use   Smoking status: Never   Smokeless tobacco: Never  Vaping Use   Vaping Use: Never used  Substance Use Topics   Alcohol use: Never   Drug use: Never       OPHTHALMIC EXAM: Base Eye Exam     Visual Acuity (Snellen - Linear)       Right Left   Dist cc 20/25 20/200  -1   Dist ph cc 20/25 +2 20/150 -2    Correction: Glasses         Tonometry (Tonopen, 1:41 PM)       Right Left   Pressure 13 13         Pupils       Dark Light Shape React APD   Right 2 1.5 Round Minimal None   Left 2 1.5 Round Minimal None         Visual Fields       Left Right    Full Full         Extraocular Movement       Right Left    Full, Ortho Full, Ortho         Neuro/Psych     Oriented x3: Yes   Mood/Affect: Normal         Dilation     Both eyes: 1.0% Mydriacyl, 2.5% Phenylephrine @ 1:42 PM           Slit Lamp and Fundus Exam     Slit Lamp Exam       Right Left   Lids/Lashes Dermatochalasis - upper lid, MGD, Ptosis Dermatochalasis - upper lid, MGD, Ptosis   Conjunctiva/Sclera Mild conjunctivochalasis, mild melanosis Mild conjunctivochalasis, mild melanosis, mild nasal and temporal pterygium   Cornea Mild Arcus, trace PEE Mild Arcus, mild nasal and temporal pterygium, trace PEE   Anterior Chamber Deep and quiet Deep and quiet   Iris Round and dilated Round and dilated   Lens PCIOL in good position PCIOL in good position, 1+ PCO   Anterior Vitreous Vitreous syneresis Vitreous syneresis, Posterior vitreous detachment         Fundus Exam       Right Left   Disc Mild pallor, Sharp rim, mild PPA Mild pallor, sharp rim   C/D Ratio 0.5 0.5   Macula Flat, Blunted foveal reflex, Drusen, RPE mottling, no heme or edema Flat, Blunted foveal reflex, drusen, RPE mottling, shallow SRF nasal macula following proximal termporal arcades - stably improved, no heme, patchy central atrophy   Vessels attenuated, Tortuous attenuated   Periphery Attached, Midzonal drusen, mild reticular degeneration, no heme Attached, Midzonal drusen, mild reticular degeneration, no heme           Refraction     Wearing Rx       Sphere Cylinder Axis Add   Right -1.50 +1.00 062 +2.50   Left -1.00 +0.50 150 +2.50         Manifest Refraction        Sphere Cylinder Axis Dist VA   Right       Left -1.00 +0.50 150 20/80  IMAGING AND PROCEDURES  Imaging and Procedures for 04/14/2022  OCT, Retina - OU - Both Eyes       Right Eye Quality was good. Central Foveal Thickness: 229. Progression has been stable. Findings include normal foveal contour, no IRF, no SRF, retinal drusen .   Left Eye Quality was good. Central Foveal Thickness: 145. Progression has been stable. Findings include normal foveal contour, no IRF, no SRF, retinal drusen , pigment epithelial detachment, outer retinal atrophy (Stable improvement in shallow SRF peripheral macula, persistent central ORA with SRHM).   Notes *Images captured and stored on drive  Diagnosis / Impression:  OD: Non exudative ARMD -- NFP; no IRF/no SRF OS: Exudative ARMD vs CSR -- Stable improvement in shallow SRF peripheral macula, persistent central ORA with SRHM  Clinical management:  See below  Abbreviations: NFP - Normal foveal profile. CME - cystoid macular edema. PED - pigment epithelial detachment. IRF - intraretinal fluid. SRF - subretinal fluid. EZ - ellipsoid zone. ERM - epiretinal membrane. ORA - outer retinal atrophy. ORT - outer retinal tubulation. SRHM - subretinal hyper-reflective material. IRHM - intraretinal hyper-reflective material      Intravitreal Injection, Pharmacologic Agent - OS - Left Eye       Time Out 04/14/2022. 2:42 PM. Confirmed correct patient, procedure, site, and patient consented.   Anesthesia Topical anesthesia was used. Anesthetic medications included Lidocaine 2%, Proparacaine 0.5%.   Procedure Preparation included 5% betadine to ocular surface, eyelid speculum. A (32g) needle was used.   Injection: 1.25 mg Bevacizumab 1.93m/0.05ml   Route: Intravitreal, Site: Left Eye   NDC:: 02725-366-44 Lot:: [id number] Expiration date: 05/31/2022   Post-op Post injection exam found visual acuity of at least counting fingers. The patient tolerated  the procedure well. There were no complications. The patient received written and verbal post procedure care education. Post injection medications were not given.             ASSESSMENT/PLAN:    ICD-10-CM   1. Exudative age-related macular degeneration of left eye with active choroidal neovascularization (HCC)  H35.3221 OCT, Retina - OU - Both Eyes    Intravitreal Injection, Pharmacologic Agent - OS - Left Eye    Bevacizumab (AVASTIN) SOLN 1.25 mg    2. 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 Essential hypertension  I10     4. Hypertensive retinopathy of both eyes  H35.033     5. Pseudophakia of both eyes  Z96.1      1. Exudative age related macular degeneration OS  - s/p IVA OS #1 (04.03.23), #2 (05.15.23) - referred by Dr. MEllie Lunchfor decreased vision and new SRF OS - FA 8.15.22 shows peripapillary CNV OS w/ mild leakage             - BCVA OS: 20/80 today -- improved  - OCT OS shows: Stable improvement in shallow shallow SRF peripheral macula, persistent central ORA with SRHM at 4 wks  - discussed most likely cause of decreased vision is central ORA rather than shallow peripheral SRF             - discussed possibility of a CSCR component contributing to SRF -- pt was on po prednisone for bullous pemphigoid. (Pt was on loading dose of 479mdaily, PO + CellCept--managed by Dermatology, Dr. HuRonalee Red - prednisone now tapered off and CellCept dosing increased  - recommend IVA OS #3 today 06.12.23, extend f/u to 6 wks   -  pt wishes to proceed with injection  - RBA of procedure discussed, questions answered - informed consent obtained and signed  - see procedure note  - f/u 6 weeks -- DFE/OCT, possible injection  2. Age related macular degeneration, non-exudative, OD             - OCT shows: OD: NFP; no IRF/no SRF             - BCVA OD 20/25  - recommend Amsler grid monitoring  3,4. Hypertensive retinopathy OU - discussed  importance of tight BP control - monitor  5. Pseudophakia OU  - s/p CE/IOL OU  - IOL in good position, doing well  - OS with mild PCO  - monitor  Ophthalmic Meds Ordered this visit:  Meds ordered this encounter  Medications   Bevacizumab (AVASTIN) SOLN 1.25 mg     Return in about 6 weeks (around 05/26/2022) for DFE, OCT, possible injection.  There are no Patient Instructions on file for this visit.  This document serves as a record of services personally performed by Gardiner Sleeper, MD, PhD. It was created on their behalf by Roselee Nova, COMT. The creation of this record is the provider's dictation and/or activities during the visit.  Electronically signed by: Roselee Nova, COMT 04/14/22 4:44 PM  This document serves as a record of services personally performed by Gardiner Sleeper, MD, PhD. It was created on their behalf by Leonie Douglas, an ophthalmic technician. The creation of this record is the provider's dictation and/or activities during the visit.    Electronically signed by: Leonie Douglas COA, 04/14/22  4:44 PM   Gardiner Sleeper, M.D., Ph.D. Diseases & Surgery of the Retina and Vitreous [facility]  I have reviewed the above documentation for accuracy and completeness, and I agree with the above. Gardiner Sleeper, M.D., Ph.D. 04/14/22 4:44 PM  Abbreviations: M myopia (nearsighted); A astigmatism; H hyperopia (farsighted); P presbyopia; Mrx spectacle prescription;  CTL contact lenses; OD right eye; OS left eye; OU both eyes  XT exotropia; ET esotropia; PEK punctate epithelial keratitis; PEE punctate epithelial erosions; DES dry eye syndrome; MGD meibomian gland dysfunction; ATs artificial tears; PFAT's preservative free artificial tears; [place] nuclear sclerotic cataract; PSC posterior subcapsular cataract; ERM epi-retinal membrane; PVD posterior vitreous detachment; RD retinal detachment; DM diabetes mellitus; DR diabetic retinopathy; NPDR non-proliferative  diabetic retinopathy; PDR proliferative diabetic retinopathy; CSME clinically significant macular edema; DME diabetic macular edema; dbh dot blot hemorrhages; CWS cotton wool spot; POAG primary open angle glaucoma; C/D cup-to-disc ratio; HVF humphrey visual field; GVF goldmann visual field; OCT optical coherence tomography; IOP intraocular pressure; BRVO Branch retinal vein occlusion; CRVO central retinal vein occlusion; CRAO central retinal artery occlusion; BRAO branch retinal artery occlusion; RT retinal tear; SB scleral buckle; PPV pars plana vitrectomy; VH Vitreous hemorrhage; PRP panretinal laser photocoagulation; IVK intravitreal kenalog; VMT vitreomacular traction; MH Macular hole;  NVD neovascularization of the disc; NVE neovascularization elsewhere; AREDS age related eye disease study; ARMD age related macular degeneration; POAG primary open angle glaucoma; EBMD epithelial/anterior basement membrane dystrophy; ACIOL anterior chamber intraocular lens; IOL intraocular lens; PCIOL posterior chamber intraocular lens; Phaco/IOL phacoemulsification with intraocular lens placement; [place] photorefractive keratectomy; LASIK laser assisted in situ keratomileusis; HTN hypertension; DM diabetes mellitus; COPD chronic obstructive pulmonary disease

## 2022-04-14 ENCOUNTER — Encounter (INDEPENDENT_AMBULATORY_CARE_PROVIDER_SITE_OTHER): Payer: Self-pay | Admitting: Ophthalmology

## 2022-04-14 ENCOUNTER — Ambulatory Visit (INDEPENDENT_AMBULATORY_CARE_PROVIDER_SITE_OTHER): Payer: PPO | Admitting: Ophthalmology

## 2022-04-14 DIAGNOSIS — H353221 Exudative age-related macular degeneration, left eye, with active choroidal neovascularization: Secondary | ICD-10-CM | POA: Diagnosis not present

## 2022-04-14 DIAGNOSIS — H35033 Hypertensive retinopathy, bilateral: Secondary | ICD-10-CM

## 2022-04-14 DIAGNOSIS — Z961 Presence of intraocular lens: Secondary | ICD-10-CM

## 2022-04-14 DIAGNOSIS — I10 Essential (primary) hypertension: Secondary | ICD-10-CM | POA: Diagnosis not present

## 2022-04-14 DIAGNOSIS — H353111 Nonexudative age-related macular degeneration, right eye, early dry stage: Secondary | ICD-10-CM

## 2022-04-14 MED ORDER — BEVACIZUMAB CHEMO INJECTION 1.25MG/0.05ML SYRINGE FOR KALEIDOSCOPE
1.2500 mg | INTRAVITREAL | Status: AC | PRN
  Administered 2022-04-14: 1.25 mg via INTRAVITREAL

## 2022-05-05 ENCOUNTER — Ambulatory Visit
Admission: RE | Admit: 2022-05-05 | Discharge: 2022-05-05 | Disposition: A | Discharge status: Home / Self Care | Payer: PPO | Source: Ambulatory Visit | Attending: Internal Medicine | Admitting: Internal Medicine

## 2022-05-05 DIAGNOSIS — R7303 Prediabetes: Secondary | ICD-10-CM | POA: Diagnosis not present

## 2022-05-05 DIAGNOSIS — Z780 Asymptomatic menopausal state: Secondary | ICD-10-CM | POA: Diagnosis not present

## 2022-05-05 DIAGNOSIS — M810 Age-related osteoporosis without current pathological fracture: Secondary | ICD-10-CM

## 2022-05-05 DIAGNOSIS — I10 Essential (primary) hypertension: Secondary | ICD-10-CM | POA: Diagnosis not present

## 2022-05-08 ENCOUNTER — Other Ambulatory Visit (HOSPITAL_COMMUNITY): Payer: Self-pay

## 2022-05-08 DIAGNOSIS — Z23 Encounter for immunization: Secondary | ICD-10-CM | POA: Diagnosis not present

## 2022-05-08 DIAGNOSIS — M810 Age-related osteoporosis without current pathological fracture: Secondary | ICD-10-CM | POA: Diagnosis not present

## 2022-05-08 DIAGNOSIS — Z1331 Encounter for screening for depression: Secondary | ICD-10-CM | POA: Diagnosis not present

## 2022-05-08 DIAGNOSIS — Z0000 Encounter for general adult medical examination without abnormal findings: Secondary | ICD-10-CM | POA: Diagnosis not present

## 2022-05-08 DIAGNOSIS — Z1389 Encounter for screening for other disorder: Secondary | ICD-10-CM | POA: Diagnosis not present

## 2022-05-08 DIAGNOSIS — I10 Essential (primary) hypertension: Secondary | ICD-10-CM | POA: Diagnosis not present

## 2022-05-08 DIAGNOSIS — R7303 Prediabetes: Secondary | ICD-10-CM | POA: Diagnosis not present

## 2022-05-08 MED ORDER — AMLODIPINE BESYLATE 5 MG PO TABS
5.0000 mg | ORAL_TABLET | Freq: Every day | ORAL | 3 refills | Status: DC
Start: 2022-05-08 — End: 2023-06-19
  Filled 2022-05-08 – 2022-05-22 (×2): qty 90, 90d supply, fill #0
  Filled 2022-09-04: qty 90, 90d supply, fill #1
  Filled 2022-11-28: qty 90, 90d supply, fill #2
  Filled 2023-03-02: qty 90, 90d supply, fill #3

## 2022-05-16 ENCOUNTER — Other Ambulatory Visit (HOSPITAL_COMMUNITY): Payer: Self-pay

## 2022-05-22 ENCOUNTER — Other Ambulatory Visit (HOSPITAL_COMMUNITY): Payer: Self-pay

## 2022-05-22 NOTE — Progress Notes (Signed)
[facility] Note  05/26/2022     CHIEF COMPLAINT Patient presents for Retina Follow Up    HISTORY OF PRESENT ILLNESS: Joanne Murray is a 87 y.o. female who presents to the clinic today for:   HPI     Retina Follow Up   Patient presents with  Wet AMD.  In left eye.  Duration of 6 weeks.  Since onset it is stable.  I, the attending physician,  performed the HPI with the patient and updated documentation appropriately.        Comments   6 week follow up Exu ARMD OS- Vision appears stable.       Last edited by Bernarda Caffey, MD on 05/26/2022  1:22 PM.    Patient states the vision is all the same.   Referring physician: No referring provider defined for this encounter.  HISTORICAL INFORMATION:   Selected notes from the MEDICAL RECORD NUMBER Referred by Dr. Ellie Lunch for concern of exudative ARMD OS   CURRENT MEDICATIONS: No current outpatient medications on file. (Ophthalmic Drugs)   No current facility-administered medications for this visit. (Ophthalmic Drugs)   Current Outpatient Medications (Other)  Medication Sig   amLODipine (NORVASC) 5 MG tablet Take 1 tablet (5 mg total) by mouth daily.   CALCIUM PO Take by mouth.   clobetasol cream (TEMOVATE) 9.14 % Apply 1 application topically 2 (two) times daily as needed.   Glucosamine-Chondroit-Calcium (TRIPLE FLEX BONE & JOINT PO) Take 50 mg by mouth.   MAGNESIUM PO Take by mouth.   mycophenolate (CELLCEPT) 500 MG tablet Take 1 tablet (500 mg total) by mouth 3 (three) times daily with meals.   ondansetron (ZOFRAN) 4 MG tablet Take 1 tablet (4 mg total) by mouth every 6 (six) hours.   tacrolimus (PROTOPIC) 0.1 % ointment Apply to dark spots on the left thigh twice a day.   triamcinolone cream (KENALOG) 0.1 % Apply 1 application topically 2 (two) times daily as needed.   VITAMIN D PO Take 3,000 Units by mouth daily.   amLODipine (NORVASC) 5 MG tablet Take 1 tablet (5 mg total) by mouth daily.    amLODipine (NORVASC) 5 MG tablet Take 1 tablet (5 mg total) by mouth daily.   mycophenolate (CELLCEPT) 500 MG tablet Take 1 tablet (500 mg total) by mouth 3 (three) times daily with meals.   mycophenolate (CELLCEPT) 500 MG tablet Take 1 tablet (500 mg total) by mouth 3 (three) times daily with meals.   predniSONE (DELTASONE) 10 MG tablet Take 10 mg by mouth daily with breakfast. (Patient not taking: Reported on 09/25/2021)   No current facility-administered medications for this visit. (Other)   REVIEW OF SYSTEMS: ROS   Positive for: Gastrointestinal, Musculoskeletal, Eyes Negative for: Constitutional, Neurological, Skin, Genitourinary, HENT, Endocrine, Cardiovascular, Respiratory, Psychiatric, Allergic/Imm, Heme/Lymph Last edited by Leonie Douglas, COA on 05/26/2022  1:10 PM.      ALLERGIES Allergies  Allergen Reactions   Lisinopril-Hydrochlorothiazide    PAST MEDICAL HISTORY Past Medical History:  Diagnosis Date   GERD (gastroesophageal reflux disease)    Grief reaction    Guttate hypomelanosis    Hypertension    Hypertensive retinopathy    Macular degeneration    Onychomycosis    Osteoporosis    Ovarian mass    Prediabetes    Rotator cuff syndrome    Thalassanemia    Past Surgical History:  Procedure Laterality Date   CATARACT EXTRACTION     CATARACT EXTRACTION, BILATERAL Bilateral  2021   EYE SURGERY     TOOTH EXTRACTION  01/07/2021   2 teeth pulled    UVULA MASS EXCISION  12/20/2014   [facility]    FAMILY HISTORY Family History  Problem Relation Age of Onset   Liver disease Father 23   SOCIAL HISTORY Social History   Tobacco Use   Smoking status: Never   Smokeless tobacco: Never  Vaping Use   Vaping Use: Never used  Substance Use Topics   Alcohol use: Never   Drug use: Never       OPHTHALMIC EXAM: Base Eye Exam     Visual Acuity (Snellen - Linear)       Right Left   Dist cc 20/25 20/200 +1   Dist ph cc NI 20/100         Tonometry  (Tonopen, 1:17 PM)       Right Left   Pressure 15 14         Pupils       Dark Light Shape React APD   Right 2 1.5 Round Minimal None   Left 2 1.5 Round Minimal None         Visual Fields (Counting fingers)       Left Right    Full Full         Extraocular Movement       Right Left    Full Full         Neuro/Psych     Oriented x3: Yes   Mood/Affect: Normal         Dilation     Both eyes: 1.0% Mydriacyl, 2.5% Phenylephrine @ 1:16 PM           Slit Lamp and Fundus Exam     Slit Lamp Exam       Right Left   Lids/Lashes Dermatochalasis - upper lid, MGD, Ptosis Dermatochalasis - upper lid, MGD, Ptosis   Conjunctiva/Sclera Mild conjunctivochalasis, mild melanosis Mild conjunctivochalasis, mild melanosis, mild nasal and temporal pterygium   Cornea Mild Arcus, trace PEE Mild Arcus, mild nasal and temporal pterygium, trace PEE   Anterior Chamber Deep and quiet Deep and quiet   Iris Round and dilated Round and dilated   Lens PCIOL in good position PCIOL in good position, 1+ PCO   Anterior Vitreous Vitreous syneresis Vitreous syneresis, Posterior vitreous detachment         Fundus Exam       Right Left   Disc Mild pallor, Sharp rim, mild PPA Mild pallor, sharp rim   C/D Ratio 0.5 0.5   Macula Flat, Blunted foveal reflex, Drusen, RPE mottling, no heme or edema Flat, Blunted foveal reflex, drusen, RPE mottling, shallow SRF nasal macula following proximal termporal arcades - stably improved, no heme, patchy central atrophy   Vessels attenuated, Tortuous attenuated, Tortuous   Periphery Attached, Midzonal drusen, mild reticular degeneration, no heme Attached, Midzonal drusen, mild reticular degeneration, no heme           Refraction     Wearing Rx       Sphere Cylinder Axis Add   Right -1.50 +1.00 062 +2.50   Left -1.00 +0.50 150 +2.50           IMAGING AND PROCEDURES  Imaging and Procedures for 05/26/2022  OCT, Retina - OU - Both Eyes        Right Eye Quality was good. Central Foveal Thickness: 228. Progression has been stable. Findings include normal foveal contour, no  IRF, no SRF, retinal drusen .   Left Eye Quality was good. Central Foveal Thickness: 119. Progression has been stable. Findings include normal foveal contour, no IRF, no SRF, retinal drusen , pigment epithelial detachment, outer retinal atrophy (Stable improvement in shallow SRF peripheral macula, -- trace pocket of SRF remains SN mac, persistent central ORA with SRHM).   Notes *Images captured and stored on drive  Diagnosis / Impression:  OD: Non exudative ARMD -- NFP; no IRF/no SRF OS: Stable improvement in shallow SRF peripheral macula -- trace pocket of SRF remains SN mac, persistent central ORA with SRHM  Clinical management:  See below  Abbreviations: NFP - Normal foveal profile. CME - cystoid macular edema. PED - pigment epithelial detachment. IRF - intraretinal fluid. SRF - subretinal fluid. EZ - ellipsoid zone. ERM - epiretinal membrane. ORA - outer retinal atrophy. ORT - outer retinal tubulation. SRHM - subretinal hyper-reflective material. IRHM - intraretinal hyper-reflective material      Intravitreal Injection, Pharmacologic Agent - OS - Left Eye       Time Out 05/26/2022. 1:21 PM. Confirmed correct patient, procedure, site, and patient consented.   Anesthesia Topical anesthesia was used. Anesthetic medications included Lidocaine 2%, Proparacaine 0.5%.   Procedure Preparation included 5% betadine to ocular surface, eyelid speculum. A supplied (32g) needle was used.   Injection: 1.25 mg Bevacizumab 1.65m/0.05ml   Route: Intravitreal, Site: Left Eye   NDC:: 96759-163-84 Lot:: [id number] Expiration date: 07/07/2022   Post-op Post injection exam found visual acuity of at least counting fingers. The patient tolerated the procedure well. There were no complications. The patient received written and verbal post procedure care education.  Post injection medications were not given.              ASSESSMENT/PLAN:    ICD-10-CM   1. Exudative age-related macular degeneration of left eye with active choroidal neovascularization (HCC)  H35.3221 OCT, Retina - OU - Both Eyes    Intravitreal Injection, Pharmacologic Agent - OS - Left Eye    Bevacizumab (AVASTIN) SOLN 1.25 mg    2. 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 Essential hypertension  I10     4. Hypertensive retinopathy of both eyes  H35.033     5. Pseudophakia of both eyes  Z96.1       1. Exudative age related macular degeneration OS  - s/p IVA OS #1 (04.03.23), #2 (05.15.23), #3 (06.12.33) - referred by Dr. MEllie Lunchfor decreased vision and new SRF OS - FA 8.15.22 shows peripapillary CNV OS w/ mild leakage             - BCVA OS: 20/100 from 20/150  - OCT OS shows: Stable improvement in shallow shallow SRF peripheral macula - tr pocket of SRF remains SN mac, persistent central ORA with SRHM at 6 wks  - discussed most likely cause of decreased vision is central ORA rather than shallow peripheral SRF             - discussed possibility of a CSCR component contributing to SRF -- pt was on po prednisone for bullous pemphigoid. (Pt was on loading dose of 461mdaily, PO + CellCept--managed by Dermatology, Dr. HuRonalee Red - prednisone now tapered off and CellCept dosing increased  - recommend IVA OS #4 today 07.24.23, may treat with Eylea in the future  - pt wishes to proceed with IVA injection  - RBA of procedure discussed, questions answered -  informed consent obtained and signed  - see procedure note  - f/u 6 weeks -- DFE/OCT, possible injection IVA vs. IVE OS  2. Age related macular degeneration, non-exudative, OD             - OCT shows: OD: NFP; no IRF/no SRF             - BCVA OD 20/25  - recommend continued [place] grid monitoring  3,4. Hypertensive retinopathy OU - discussed importance of tight BP  control - continue to monitor  5. Pseudophakia OU  - s/p CE/IOL OU  - IOL in good position, doing well  - OS with mild PCO  - continue to monitor  Ophthalmic Meds Ordered this visit:  Meds ordered this encounter  Medications   Bevacizumab (AVASTIN) SOLN 1.25 mg     Return in about 6 weeks (around 07/07/2022) for DFE, OCT, Possible, IVA vs., IVE.  There are no Patient Instructions on file for this visit.  This document serves as a record of services personally performed by Gardiner Sleeper, MD, PhD. It was created on their behalf by Roselee Nova, COMT. The creation of this record is the provider's dictation and/or activities during the visit.  Electronically signed by: Roselee Nova, COMT 05/26/22 1:45 PM  This document serves as a record of services personally performed by Gardiner Sleeper, MD, PhD. It was created on their behalf by Renaldo Reel, [place] an ophthalmic technician. The creation of this record is the provider's dictation and/or activities during the visit.    Electronically signed by:  Renaldo Reel, COT 05/26/22 1:45 PM  Gardiner Sleeper, M.D., Ph.D. Diseases & Surgery of the Retina and Vitreous [facility]  I have reviewed the above documentation for accuracy and completeness, and I agree with the above. Gardiner Sleeper, M.D., Ph.D. 05/26/22 1:47 PM   Abbreviations: M myopia (nearsighted); A astigmatism; H hyperopia (farsighted); P presbyopia; Mrx spectacle prescription;  CTL contact lenses; OD right eye; OS left eye; OU both eyes  XT exotropia; ET esotropia; PEK punctate epithelial keratitis; PEE punctate epithelial erosions; DES dry eye syndrome; MGD meibomian gland dysfunction; ATs artificial tears; PFAT's preservative free artificial tears; [place] nuclear sclerotic cataract; PSC posterior subcapsular cataract; ERM epi-retinal membrane; PVD posterior vitreous detachment; RD retinal detachment; DM diabetes mellitus; DR diabetic retinopathy;  NPDR non-proliferative diabetic retinopathy; PDR proliferative diabetic retinopathy; CSME clinically significant macular edema; DME diabetic macular edema; dbh dot blot hemorrhages; CWS cotton wool spot; POAG primary open angle glaucoma; C/D cup-to-disc ratio; HVF humphrey visual field; GVF goldmann visual field; OCT optical coherence tomography; IOP intraocular pressure; BRVO Branch retinal vein occlusion; CRVO central retinal vein occlusion; CRAO central retinal artery occlusion; BRAO branch retinal artery occlusion; RT retinal tear; SB scleral buckle; PPV pars plana vitrectomy; VH Vitreous hemorrhage; PRP panretinal laser photocoagulation; IVK intravitreal kenalog; VMT vitreomacular traction; MH Macular hole;  NVD neovascularization of the disc; NVE neovascularization elsewhere; AREDS age related eye disease study; ARMD age related macular degeneration; POAG primary open angle glaucoma; EBMD epithelial/anterior basement membrane dystrophy; ACIOL anterior chamber intraocular lens; IOL intraocular lens; PCIOL posterior chamber intraocular lens; Phaco/IOL phacoemulsification with intraocular lens placement; [place] photorefractive keratectomy; LASIK laser assisted in situ keratomileusis; HTN hypertension; DM diabetes mellitus; COPD chronic obstructive pulmonary disease

## 2022-05-23 ENCOUNTER — Other Ambulatory Visit (HOSPITAL_COMMUNITY): Payer: Self-pay

## 2022-05-26 ENCOUNTER — Encounter (INDEPENDENT_AMBULATORY_CARE_PROVIDER_SITE_OTHER): Payer: Self-pay | Admitting: Ophthalmology

## 2022-05-26 ENCOUNTER — Ambulatory Visit (INDEPENDENT_AMBULATORY_CARE_PROVIDER_SITE_OTHER): Payer: PPO | Admitting: Ophthalmology

## 2022-05-26 DIAGNOSIS — H353221 Exudative age-related macular degeneration, left eye, with active choroidal neovascularization: Secondary | ICD-10-CM | POA: Diagnosis not present

## 2022-05-26 DIAGNOSIS — I10 Essential (primary) hypertension: Secondary | ICD-10-CM | POA: Diagnosis not present

## 2022-05-26 DIAGNOSIS — Z961 Presence of intraocular lens: Secondary | ICD-10-CM

## 2022-05-26 DIAGNOSIS — H35033 Hypertensive retinopathy, bilateral: Secondary | ICD-10-CM | POA: Diagnosis not present

## 2022-05-26 DIAGNOSIS — H353111 Nonexudative age-related macular degeneration, right eye, early dry stage: Secondary | ICD-10-CM

## 2022-05-26 MED ORDER — BEVACIZUMAB CHEMO INJECTION 1.25MG/0.05ML SYRINGE FOR KALEIDOSCOPE
1.2500 mg | INTRAVITREAL | Status: AC | PRN
  Administered 2022-05-26: 1.25 mg via INTRAVITREAL

## 2022-06-05 ENCOUNTER — Other Ambulatory Visit (HOSPITAL_COMMUNITY): Payer: Self-pay

## 2022-06-05 DIAGNOSIS — L120 Bullous pemphigoid: Secondary | ICD-10-CM | POA: Diagnosis not present

## 2022-06-05 DIAGNOSIS — Z5181 Encounter for therapeutic drug level monitoring: Secondary | ICD-10-CM | POA: Diagnosis not present

## 2022-06-05 DIAGNOSIS — Z79624 Long term (current) use of inhibitors of nucleotide synthesis: Secondary | ICD-10-CM | POA: Diagnosis not present

## 2022-06-05 MED ORDER — MYCOPHENOLATE MOFETIL 500 MG PO TABS
ORAL_TABLET | ORAL | 0 refills | Status: AC
  Filled 2022-06-05: qty 150, 90d supply, fill #0
  Filled 2022-07-17: qty 88, 74d supply, fill #0

## 2022-06-13 ENCOUNTER — Other Ambulatory Visit (HOSPITAL_COMMUNITY): Payer: Self-pay

## 2022-06-17 DIAGNOSIS — H02403 Unspecified ptosis of bilateral eyelids: Secondary | ICD-10-CM | POA: Diagnosis not present

## 2022-06-17 DIAGNOSIS — H353221 Exudative age-related macular degeneration, left eye, with active choroidal neovascularization: Secondary | ICD-10-CM | POA: Diagnosis not present

## 2022-06-17 DIAGNOSIS — H353112 Nonexudative age-related macular degeneration, right eye, intermediate dry stage: Secondary | ICD-10-CM | POA: Diagnosis not present

## 2022-06-17 DIAGNOSIS — H52203 Unspecified astigmatism, bilateral: Secondary | ICD-10-CM | POA: Diagnosis not present

## 2022-07-03 NOTE — Progress Notes (Signed)
[facility] Note  07/09/2022     CHIEF COMPLAINT Patient presents for Retina Follow Up    HISTORY OF PRESENT ILLNESS: Joanne Murray is a 87 y.o. female who presents to the clinic today for:   HPI     Retina Follow Up   Patient presents with  Wet AMD.  In left eye.  Severity is moderate.  Duration of 6 weeks.  Since onset it is stable.  I, the attending physician,  performed the HPI with the patient and updated documentation appropriately.        Comments   Pt here for 6 wk ret f/u exu ARMD OS. Pt states VA is about the same.       Last edited by Bernarda Caffey, MD on 07/09/2022  8:41 PM.     Referring physician: No referring provider defined for this encounter.  HISTORICAL INFORMATION:   Selected notes from the MEDICAL RECORD NUMBER Referred by Dr. Ellie Lunch for concern of exudative ARMD OS   CURRENT MEDICATIONS: No current outpatient medications on file. (Ophthalmic Drugs)   No current facility-administered medications for this visit. (Ophthalmic Drugs)   Current Outpatient Medications (Other)  Medication Sig   amLODipine (NORVASC) 5 MG tablet Take 1 tablet (5 mg total) by mouth daily.   amLODipine (NORVASC) 5 MG tablet Take 1 tablet (5 mg total) by mouth daily.   amLODipine (NORVASC) 5 MG tablet Take 1 tablet (5 mg total) by mouth daily.   CALCIUM PO Take by mouth.   clobetasol cream (TEMOVATE) 5.00 % Apply 1 application topically 2 (two) times daily as needed.   Glucosamine-Chondroit-Calcium (TRIPLE FLEX BONE & JOINT PO) Take 50 mg by mouth.   MAGNESIUM PO Take by mouth.   mycophenolate (CELLCEPT) 500 MG tablet Take 1 tablet (500 mg total) by mouth 3 (three) times daily with meals.   mycophenolate (CELLCEPT) 500 MG tablet Take 1 tablet (500 mg total) by mouth 3 (three) times daily with meals.   mycophenolate (CELLCEPT) 500 MG tablet Take 1 tablet (500 mg total) by mouth 3 (three) times daily with meals.   mycophenolate (CELLCEPT) 500  MG tablet Take 1 tablet (500 mg total) by mouth 2 (two) times daily with a meal for 2 months, THEN 1 tablet (500 mg total) daily for 2 months, then stop.   ondansetron (ZOFRAN) 4 MG tablet Take 1 tablet (4 mg total) by mouth every 6 (six) hours.   tacrolimus (PROTOPIC) 0.1 % ointment Apply to dark spots on the left thigh twice a day.   triamcinolone cream (KENALOG) 0.1 % Apply 1 application topically 2 (two) times daily as needed.   VITAMIN D PO Take 3,000 Units by mouth daily.   predniSONE (DELTASONE) 10 MG tablet Take 10 mg by mouth daily with breakfast. (Patient not taking: Reported on 09/25/2021)   No current facility-administered medications for this visit. (Other)   REVIEW OF SYSTEMS: ROS   Positive for: Gastrointestinal, Musculoskeletal, Eyes Negative for: Constitutional, Neurological, Skin, Genitourinary, HENT, Endocrine, Cardiovascular, Respiratory, Psychiatric, Allergic/Imm, Heme/Lymph Last edited by Kingsley Spittle, COT on 07/09/2022  1:23 PM.       ALLERGIES Allergies  Allergen Reactions   Lisinopril-Hydrochlorothiazide    PAST MEDICAL HISTORY Past Medical History:  Diagnosis Date   GERD (gastroesophageal reflux disease)    Grief reaction    Guttate hypomelanosis    Hypertension    Hypertensive retinopathy    Macular degeneration    Onychomycosis    Osteoporosis  Ovarian mass    Prediabetes    Rotator cuff syndrome    Thalassanemia    Past Surgical History:  Procedure Laterality Date   CATARACT EXTRACTION     CATARACT EXTRACTION, BILATERAL Bilateral 2021   EYE SURGERY     TOOTH EXTRACTION  01/07/2021   2 teeth pulled    UVULA MASS EXCISION  12/20/2014   [facility]    FAMILY HISTORY Family History  Problem Relation Age of Onset   Liver disease Father 26   SOCIAL HISTORY Social History   Tobacco Use   Smoking status: Never   Smokeless tobacco: Never  Vaping Use   Vaping Use: Never used  Substance Use Topics   Alcohol use: Never    Drug use: Never       OPHTHALMIC EXAM: Base Eye Exam     Visual Acuity (Snellen - Linear)       Right Left   Dist cc 20/25 20/250   Dist ph cc NI 20/100 +1    Correction: Glasses         Tonometry (Tonopen, 1:31 PM)       Right Left   Pressure 13 15         Pupils       Dark Light Shape React APD   Right 2 1.5 Round Brisk None   Left 2 1.5 Round Brisk None         Visual Fields (Counting fingers)       Left Right    Full Full         Extraocular Movement       Right Left    Full, Ortho Full, Ortho         Neuro/Psych     Oriented x3: Yes   Mood/Affect: Normal         Dilation     Both eyes: 1.0% Mydriacyl, 2.5% Phenylephrine @ 1:31 PM           Slit Lamp and Fundus Exam     Slit Lamp Exam       Right Left   Lids/Lashes Dermatochalasis - upper lid, MGD, Ptosis Dermatochalasis - upper lid, MGD, Ptosis   Conjunctiva/Sclera Mild conjunctivochalasis, mild melanosis Mild conjunctivochalasis, mild melanosis, mild nasal and temporal pterygium   Cornea Mild Arcus, trace PEE Mild Arcus, mild nasal and temporal pterygium, trace PEE   Anterior Chamber Deep and quiet Deep and quiet   Iris Round and dilated Round and dilated   Lens PCIOL in good position PCIOL in good position, 1+ PCO   Anterior Vitreous Vitreous syneresis Vitreous syneresis, Posterior vitreous detachment         Fundus Exam       Right Left   Disc Mild pallor, Sharp rim, mild PPA Mild pallor, sharp rim   C/D Ratio 0.5 0.5   Macula Flat, Blunted foveal reflex, Drusen, RPE mottling, clumping and atrophy, no heme or edema Flat, Blunted foveal reflex, drusen, RPE mottling, shallow SRF nasal macula following proximal termporal arcades - improved, no heme, patchy central atrophy   Vessels attenuated, Tortuous attenuated, Tortuous   Periphery Attached, Midzonal drusen, mild reticular degeneration, no heme Attached, Midzonal drusen, mild reticular degeneration, no heme            Refraction     Wearing Rx       Sphere Cylinder Axis Add   Right -1.50 +1.00 062 +2.50   Left -1.00 +0.50 150 +2.50  IMAGING AND PROCEDURES  Imaging and Procedures for 07/09/2022  OCT, Retina - OU - Both Eyes       Right Eye Quality was good. Central Foveal Thickness: 229. Progression has been stable. Findings include normal foveal contour, no IRF, no SRF, retinal drusen .   Left Eye Quality was good. Central Foveal Thickness: 115. Progression has improved. Findings include normal foveal contour, no IRF, no SRF, retinal drusen , pigment epithelial detachment, outer retinal atrophy (interval improvement in peripapillary SRF (SN mac), persistent central ORA with SRHM).   Notes *Images captured and stored on drive  Diagnosis / Impression:  OD: Non exudative ARMD -- NFP; no IRF/no SRF OS: Exudative ARMD - interval improvement in peripapillary SRF (SN mac), persistent central ORA with SRHM  Clinical management:  See below  Abbreviations: NFP - Normal foveal profile. CME - cystoid macular edema. PED - pigment epithelial detachment. IRF - intraretinal fluid. SRF - subretinal fluid. EZ - ellipsoid zone. ERM - epiretinal membrane. ORA - outer retinal atrophy. ORT - outer retinal tubulation. SRHM - subretinal hyper-reflective material. IRHM - intraretinal hyper-reflective material      Intravitreal Injection, Pharmacologic Agent - OS - Left Eye       Time Out 07/09/2022. 2:36 PM. Confirmed correct patient, procedure, site, and patient consented.   Anesthesia Topical anesthesia was used. Anesthetic medications included Lidocaine 2%, Proparacaine 0.5%.   Procedure Preparation included 5% betadine to ocular surface, eyelid speculum. A (32g) needle was used.   Injection: 1.25 mg Bevacizumab 1.30m/0.05ml   Route: Intravitreal, Site: Left Eye   NDC:: 37858-850-27 Lot:: [id number] Expiration date: 08/12/2022   Post-op Post injection exam found visual acuity of at  least counting fingers. The patient tolerated the procedure well. There were no complications. The patient received written and verbal post procedure care education. Post injection medications were not given.            ASSESSMENT/PLAN:    ICD-10-CM   1. Exudative age-related macular degeneration of left eye with active choroidal neovascularization (HCC)  H35.3221 OCT, Retina - OU - Both Eyes    Intravitreal Injection, Pharmacologic Agent - OS - Left Eye    Bevacizumab (AVASTIN) SOLN 1.25 mg    2. 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 Essential hypertension  I10     4. Hypertensive retinopathy of both eyes  H35.033     5. Pseudophakia of both eyes  Z96.1      1. Exudative age related macular degeneration OS  - s/p IVA OS #1 (04.03.23), #2 (05.15.23), #3 (06.12.33), #4 (07.24.23) - referred by Dr. MEllie Lunchfor decreased vision and new SRF OS - FA 8.15.22 shows peripapillary CNV OS w/ mild leakage             - BCVA OS: 20/100 from 20/150  - OCT OS: interval improvement in peripapillary SRF (SN mac), persistent central ORA with SRHM at 6 weeks  - discussed most likely cause of decreased vision is central ORA rather than shallow peripheral SRF             - discussed possibility of a CSCR component contributing to SRF -- pt was on po prednisone for bullous pemphigoid. (Pt was on loading dose of 425mdaily, PO + CellCept--managed by Dermatology, Dr. HuRonalee Red - prednisone now tapered off and CellCept dosing increased  - recommend IVA OS #5 today 09.06.23 with extension to 8 weeks - may treat with  Eylea in the future  - pt wishes to proceed with IVA injection  - RBA of procedure discussed, questions answered - informed consent obtained and signed  - see procedure note  - f/u 8 weeks -- DFE/OCT, possible injection IVA vs. IVE OS  2. Age related macular degeneration, non-exudative, OD             - OCT shows: OD: NFP; no IRF/no SRF              - BCVA OD 20/25  - recommend continued Amsler grid monitoring  3,4. Hypertensive retinopathy OU - discussed importance of tight BP control - continue to monitor  5. Pseudophakia OU  - s/p CE/IOL OU  - IOL in good position, doing well  - OS with mild PCO  - continue to monitor  Ophthalmic Meds Ordered this visit:  Meds ordered this encounter  Medications   Bevacizumab (AVASTIN) SOLN 1.25 mg     Return in about 8 weeks (around 09/03/2022) for f/u exu ARMD OS, DFE, OCT.  There are no Patient Instructions on file for this visit.  This document serves as a record of services personally performed by Gardiner Sleeper, MD, PhD. It was created on their behalf by Orvan Falconer, an ophthalmic technician. The creation of this record is the provider's dictation and/or activities during the visit.    Electronically signed by: Orvan Falconer, OA, 07/09/22  8:48 PM  This document serves as a record of services personally performed by Gardiner Sleeper, MD, PhD. It was created on their behalf by San Jetty. Owens Shark, OA an ophthalmic technician. The creation of this record is the provider's dictation and/or activities during the visit.    Electronically signed by: San Jetty. Owens Shark, [place] 09.06.2023 8:48 PM  Gardiner Sleeper, M.D., Ph.D. Diseases & Surgery of the Retina and Vitreous [facility]  I have reviewed the above documentation for accuracy and completeness, and I agree with the above. Gardiner Sleeper, M.D., Ph.D. 07/09/22 8:50 PM   Abbreviations: M myopia (nearsighted); A astigmatism; H hyperopia (farsighted); P presbyopia; Mrx spectacle prescription;  CTL contact lenses; OD right eye; OS left eye; OU both eyes  XT exotropia; ET esotropia; PEK punctate epithelial keratitis; PEE punctate epithelial erosions; DES dry eye syndrome; MGD meibomian gland dysfunction; ATs artificial tears; PFAT's preservative free artificial tears; Bennett nuclear sclerotic cataract; PSC  posterior subcapsular cataract; ERM epi-retinal membrane; PVD posterior vitreous detachment; RD retinal detachment; DM diabetes mellitus; DR diabetic retinopathy; NPDR non-proliferative diabetic retinopathy; PDR proliferative diabetic retinopathy; CSME clinically significant macular edema; DME diabetic macular edema; dbh dot blot hemorrhages; CWS cotton wool spot; POAG primary open angle glaucoma; C/D cup-to-disc ratio; HVF humphrey visual field; GVF goldmann visual field; OCT optical coherence tomography; IOP intraocular pressure; BRVO Branch retinal vein occlusion; CRVO central retinal vein occlusion; CRAO central retinal artery occlusion; BRAO branch retinal artery occlusion; RT retinal tear; SB scleral buckle; PPV pars plana vitrectomy; VH Vitreous hemorrhage; PRP panretinal laser photocoagulation; IVK intravitreal kenalog; VMT vitreomacular traction; MH Macular hole;  NVD neovascularization of the disc; NVE neovascularization elsewhere; AREDS age related eye disease study; ARMD age related macular degeneration; POAG primary open angle glaucoma; EBMD epithelial/anterior basement membrane dystrophy; ACIOL anterior chamber intraocular lens; IOL intraocular lens; PCIOL posterior chamber intraocular lens; Phaco/IOL phacoemulsification with intraocular lens placement; [place] photorefractive keratectomy; LASIK laser assisted in situ keratomileusis; HTN hypertension; DM diabetes mellitus; COPD chronic obstructive pulmonary disease

## 2022-07-09 ENCOUNTER — Ambulatory Visit (INDEPENDENT_AMBULATORY_CARE_PROVIDER_SITE_OTHER): Payer: PPO | Admitting: Ophthalmology

## 2022-07-09 ENCOUNTER — Encounter (INDEPENDENT_AMBULATORY_CARE_PROVIDER_SITE_OTHER): Payer: Self-pay | Admitting: Ophthalmology

## 2022-07-09 DIAGNOSIS — H353111 Nonexudative age-related macular degeneration, right eye, early dry stage: Secondary | ICD-10-CM

## 2022-07-09 DIAGNOSIS — Z961 Presence of intraocular lens: Secondary | ICD-10-CM

## 2022-07-09 DIAGNOSIS — I10 Essential (primary) hypertension: Secondary | ICD-10-CM

## 2022-07-09 DIAGNOSIS — H35033 Hypertensive retinopathy, bilateral: Secondary | ICD-10-CM | POA: Diagnosis not present

## 2022-07-09 DIAGNOSIS — H353221 Exudative age-related macular degeneration, left eye, with active choroidal neovascularization: Secondary | ICD-10-CM | POA: Diagnosis not present

## 2022-07-09 MED ORDER — BEVACIZUMAB CHEMO INJECTION 1.25MG/0.05ML SYRINGE FOR KALEIDOSCOPE
1.2500 mg | INTRAVITREAL | Status: AC | PRN
  Administered 2022-07-09: 1.25 mg via INTRAVITREAL

## 2022-07-11 ENCOUNTER — Other Ambulatory Visit (HOSPITAL_COMMUNITY): Payer: Self-pay | Admitting: *Deleted

## 2022-07-14 ENCOUNTER — Ambulatory Visit (HOSPITAL_COMMUNITY)
Admission: RE | Admit: 2022-07-14 | Discharge: 2022-07-14 | Disposition: A | Discharge status: Home / Self Care | Payer: PPO | Source: Ambulatory Visit | Attending: Internal Medicine | Admitting: Internal Medicine

## 2022-07-14 DIAGNOSIS — M810 Age-related osteoporosis without current pathological fracture: Secondary | ICD-10-CM | POA: Diagnosis not present

## 2022-07-14 MED ORDER — DENOSUMAB 60 MG/ML ~~LOC~~ SOSY: 60.0000 mg | PREFILLED_SYRINGE | Freq: Once | SUBCUTANEOUS | Status: AC

## 2022-07-14 MED ORDER — DENOSUMAB 60 MG/ML ~~LOC~~ SOSY
PREFILLED_SYRINGE | SUBCUTANEOUS | Status: AC
  Administered 2022-07-14: 60 mg via SUBCUTANEOUS
  Filled 2022-07-14: qty 1

## 2022-07-17 ENCOUNTER — Other Ambulatory Visit (HOSPITAL_COMMUNITY): Payer: Self-pay

## 2022-07-24 DIAGNOSIS — H02831 Dermatochalasis of right upper eyelid: Secondary | ICD-10-CM | POA: Diagnosis not present

## 2022-07-24 DIAGNOSIS — H02421 Myogenic ptosis of right eyelid: Secondary | ICD-10-CM | POA: Diagnosis not present

## 2022-07-24 DIAGNOSIS — H02412 Mechanical ptosis of left eyelid: Secondary | ICD-10-CM | POA: Diagnosis not present

## 2022-07-24 DIAGNOSIS — H02413 Mechanical ptosis of bilateral eyelids: Secondary | ICD-10-CM | POA: Diagnosis not present

## 2022-07-24 DIAGNOSIS — H02423 Myogenic ptosis of bilateral eyelids: Secondary | ICD-10-CM | POA: Diagnosis not present

## 2022-07-24 DIAGNOSIS — H0279 Other degenerative disorders of eyelid and periocular area: Secondary | ICD-10-CM | POA: Diagnosis not present

## 2022-07-24 DIAGNOSIS — H53483 Generalized contraction of visual field, bilateral: Secondary | ICD-10-CM | POA: Diagnosis not present

## 2022-07-24 DIAGNOSIS — H02422 Myogenic ptosis of left eyelid: Secondary | ICD-10-CM | POA: Diagnosis not present

## 2022-07-24 DIAGNOSIS — H02834 Dermatochalasis of left upper eyelid: Secondary | ICD-10-CM | POA: Diagnosis not present

## 2022-07-24 DIAGNOSIS — H02411 Mechanical ptosis of right eyelid: Secondary | ICD-10-CM | POA: Diagnosis not present

## 2022-08-27 DIAGNOSIS — H02834 Dermatochalasis of left upper eyelid: Secondary | ICD-10-CM | POA: Diagnosis not present

## 2022-08-27 DIAGNOSIS — H02831 Dermatochalasis of right upper eyelid: Secondary | ICD-10-CM | POA: Diagnosis not present

## 2022-09-03 ENCOUNTER — Encounter (INDEPENDENT_AMBULATORY_CARE_PROVIDER_SITE_OTHER): Payer: PPO | Admitting: Ophthalmology

## 2022-09-04 ENCOUNTER — Other Ambulatory Visit (HOSPITAL_COMMUNITY): Payer: Self-pay

## 2022-11-28 ENCOUNTER — Other Ambulatory Visit (HOSPITAL_COMMUNITY): Payer: Self-pay

## 2022-12-01 ENCOUNTER — Other Ambulatory Visit (HOSPITAL_COMMUNITY): Payer: Self-pay

## 2023-01-14 NOTE — Progress Notes (Signed)
[facility] Note  01/21/2023     CHIEF COMPLAINT Patient presents for Retina Follow Up    HISTORY OF PRESENT ILLNESS: Joanne Murray is a 88 y.o. female who presents to the clinic today for:   HPI     Retina Follow Up   Patient presents with  Wet AMD.  In both eyes.  This started 6 months ago.  Duration of 6 months.  Since onset it is stable.  I, the attending physician,  performed the HPI with the patient and updated documentation appropriately.        Comments   6 month retina follow up pt is 4 month delayed after being at her sons in [place] pt is reporting that her vision is not as sharp she has noticed some floaters but denies any flashes of light       Last edited by Bernarda Caffey, MD on 01/23/2023  2:13 AM.    Pt states she did not come back after September 2023 due to pain from injection. Had 'white on her eyeball'.   Referring physician: No referring provider defined for this encounter.  HISTORICAL INFORMATION:   Selected notes from the MEDICAL RECORD NUMBER Referred by Dr. Ellie Lunch for concern of exudative ARMD OS   CURRENT MEDICATIONS: No current outpatient medications on file. (Ophthalmic Drugs)   No current facility-administered medications for this visit. (Ophthalmic Drugs)   Current Outpatient Medications (Other)  Medication Sig   amLODipine (NORVASC) 5 MG tablet Take 1 tablet (5 mg total) by mouth daily.   amLODipine (NORVASC) 5 MG tablet Take 1 tablet (5 mg total) by mouth daily.   amLODipine (NORVASC) 5 MG tablet Take 1 tablet (5 mg total) by mouth daily.   CALCIUM PO Take by mouth.   clobetasol cream (TEMOVATE) [id number] % Apply 1 application topically 2 (two) times daily as needed.   Glucosamine-Chondroit-Calcium (TRIPLE FLEX BONE & JOINT PO) Take 50 mg by mouth.   MAGNESIUM PO Take by mouth.   mycophenolate (CELLCEPT) 500 MG tablet Take 1 tablet (500 mg total) by mouth 3 (three) times daily with meals.   mycophenolate  (CELLCEPT) 500 MG tablet Take 1 tablet (500 mg total) by mouth 3 (three) times daily with meals.   mycophenolate (CELLCEPT) 500 MG tablet Take 1 tablet (500 mg total) by mouth 3 (three) times daily with meals.   ondansetron (ZOFRAN) 4 MG tablet Take 1 tablet (4 mg total) by mouth every 6 (six) hours.   predniSONE (DELTASONE) 10 MG tablet Take 10 mg by mouth daily with breakfast. (Patient not taking: Reported on 09/25/2021)   tacrolimus (PROTOPIC) 0.1 % ointment Apply to dark spots on the left thigh twice a day.   triamcinolone cream (KENALOG) 0.1 % Apply 1 application topically 2 (two) times daily as needed.   VITAMIN D PO Take 3,000 Units by mouth daily.   No current facility-administered medications for this visit. (Other)   REVIEW OF SYSTEMS: ROS   Positive for: Gastrointestinal, Musculoskeletal, Eyes Negative for: Constitutional, Neurological, Skin, Genitourinary, HENT, Endocrine, Cardiovascular, Respiratory, Psychiatric, Allergic/Imm, Heme/Lymph Last edited by Parthenia Ames, COT on 01/21/2023  2:41 PM.     ALLERGIES Allergies  Allergen Reactions   Lisinopril-Hydrochlorothiazide    PAST MEDICAL HISTORY Past Medical History:  Diagnosis Date   GERD (gastroesophageal reflux disease)    Grief reaction    Guttate hypomelanosis    Hypertension    Hypertensive retinopathy    Macular degeneration  Onychomycosis    Osteoporosis    Ovarian mass    Prediabetes    Rotator cuff syndrome    Thalassanemia    Past Surgical History:  Procedure Laterality Date   CATARACT EXTRACTION     CATARACT EXTRACTION, BILATERAL Bilateral 2021   EYE SURGERY     TOOTH EXTRACTION  01/07/2021   2 teeth pulled    UVULA MASS EXCISION  12/20/2014   [facility]    FAMILY HISTORY Family History  Problem Relation Age of Onset   Liver disease Father 40   SOCIAL HISTORY Social History   Tobacco Use   Smoking status: Never   Smokeless tobacco: Never  Vaping Use   Vaping Use:  Never used  Substance Use Topics   Alcohol use: Never   Drug use: Never       OPHTHALMIC EXAM: Base Eye Exam     Visual Acuity (Snellen - Linear)       Right Left   Dist cc 20/25 -2 CF at 3'   Dist ph cc NI NI    Correction: Glasses         Tonometry (Tonopen, 2:45 PM)       Right Left   Pressure 14 16         Pupils       Pupils Dark Light Shape React APD   Right PERRL 3 2 Round Sluggish None   Left PERRL 3 2 Round Sluggish None         Visual Fields       Left Right    Full Full         Extraocular Movement       Right Left    Full, Ortho Full, Ortho         Neuro/Psych     Oriented x3: Yes   Mood/Affect: Normal         Dilation     Both eyes: 2.5% Phenylephrine @ 2:45 PM           Slit Lamp and Fundus Exam     Slit Lamp Exam       Right Left   Lids/Lashes Dermatochalasis - upper lid, MGD, Ptosis Dermatochalasis - upper lid, MGD, Ptosis   Conjunctiva/Sclera mild melanosis mild melanosis, mild nasal and temporal pterygium   Cornea Mild Arcus, trace PEE Mild Arcus, mild nasal and temporal pterygium, trace PEE   Anterior Chamber Deep and quiet Deep and quiet   Iris Round and dilated Round and dilated   Lens PCIOL in good position PCIOL in good position, 1+ PCO   Anterior Vitreous Vitreous syneresis Vitreous syneresis, Posterior vitreous detachment         Fundus Exam       Right Left   Disc Mild pallor, Sharp rim, mild PPA Mild pallor, sharp rim   C/D Ratio 0.4 0.5   Macula Flat, Blunted foveal reflex, Drusen, RPE mottling, clumping and atrophy, no heme or edema Flat, Blunted foveal reflex, drusen, RPE mottling, shallow SRF nasal macula following proximal termporal arcades - stably improved, no heme, Central [place]   Vessels attenuated, Tortuous attenuated, Tortuous   Periphery Attached, Midzonal drusen, mild reticular degeneration, no heme Attached, Midzonal drusen, mild reticular degeneration, no heme            Refraction     Wearing Rx       Sphere Cylinder Axis Add   Right -1.50 +1.00 062 +2.50   Left -1.00 +0.50 150 +2.50  IMAGING AND PROCEDURES  Imaging and Procedures for 01/21/2023  OCT, Retina - OU - Both Eyes       Right Eye Quality was good. Central Foveal Thickness: 231. Progression has been stable. Findings include normal foveal contour, no IRF, no SRF, retinal drusen .   Left Eye Quality was good. Central Foveal Thickness: 146. Progression has worsened. Findings include normal foveal contour, no IRF, no SRF, retinal drusen , pigment epithelial detachment, outer retinal atrophy (Stable improvement in peripapillary SRF (SN mac), mild interval progression of central atrophy with [facility]).   Notes *Images captured and stored on drive  Diagnosis / Impression:  OD: Non exudative ARMD -- NFP; no IRF/no SRF OS: Exudative ARMD - Stable improvement in peripapillary SRF (SN mac), mild interval progression of central atrophy with [facility]  Clinical management:  See below  Abbreviations: NFP - Normal foveal profile. CME - cystoid macular edema. PED - pigment epithelial detachment. IRF - intraretinal fluid. SRF - subretinal fluid. EZ - ellipsoid zone. ERM - epiretinal membrane. ORA - outer retinal atrophy. ORT - outer retinal tubulation. SRHM - subretinal hyper-reflective material. IRHM - intraretinal hyper-reflective material            ASSESSMENT/PLAN:    ICD-10-CM   1. Exudative age-related macular degeneration of left eye with active choroidal neovascularization (HCC)  H35.3221 OCT, Retina - OU - Both Eyes    2. Intermediate stage nonexudative age-related macular degeneration of right eye  H35.3112     3. Essential hypertension  I10     4. Hypertensive retinopathy of both eyes  H35.033     5. Pseudophakia of both eyes  Z96.1      1. Exudative age related macular degeneration OS  - pt lost to f/u from 09.06.23 to 03.20.23 (6+ mos)  - s/p IVA OS #1 (04.03.23),  #2 (05.15.23), #3 (06.12.33), #4 (07.24.23), #5 (09.06.23) - referred by Dr. Ellie Lunch for decreased vision and new SRF OS - FA 8.15.22 shows peripapillary CNV OS w/ mild leakage             - BCVA OS: 20/CF@3 ' from 20/100. Pt delayed 6 months from September-reports resistance to come back due to pain from injection in September 2023 and travel.   - OCT OS: Stable improvement in peripapillary SRF (SN mac), mild interval progression of central atrophy with [facility]  - discussed most likely cause of decreased vision is central ORA rather than shallow peripheral SRF             - discussed possibility of a CSCR component contributing to SRF -- pt was on po prednisone for bullous pemphigoid. (Pt was on loading dose of 40mg  daily, PO + CellCept--managed by Dermatology, Dr. Ronalee Red)  - prednisone now tapered off and CellCept dosing increased  - recommend holding IVA OS, due to progression of central atrophy  - discussed RBA of new intravitreal injections -- Syfovre and Izervay -- pt wishes to hold off  - pt in agreement in holding off on any intravitreal injections  - discussed our primary focus will now be in preserving vision in OD  - f/u 3 months, DFE, OCT   2. Age related macular degeneration, non-exudative, OD             - OCT OD shows NFP; no IRF/no SRF             - BCVA OD 20/25  - reviewed Amsler grid monitoring and pt given new Amsler grid  - f/u in  3 mos, sooner prn -- DFE/OCT  3,4. Hypertensive retinopathy OU - discussed importance of tight BP control - continue to monitor  5. Pseudophakia OU  - s/p CE/IOL OU  - IOL in good position, doing well  - OS with mild PCO  - continue to monitor  Ophthalmic Meds Ordered this visit:  No orders of the defined types were placed in this encounter.    Return in about 3 months (around 04/23/2023) for non exu ARMD OU; DFE, OCT .  There are no Patient Instructions on file for this visit.  This document serves as a record of services personally  performed by Gardiner Sleeper, MD, PhD. It was created on their behalf by Orvan Falconer, an ophthalmic technician. The creation of this record is the provider's dictation and/or activities during the visit.    Electronically signed by: Orvan Falconer, OA, 01/23/23  2:24 AM  This document serves as a record of services personally performed by Gardiner Sleeper, MD, PhD. It was created on their behalf by San Jetty. Owens Shark, OA an ophthalmic technician. The creation of this record is the provider's dictation and/or activities during the visit.    Electronically signed by: San Jetty. Owens Shark, OA 2:24 AM  Gardiner Sleeper, M.D., Ph.D. Diseases & Surgery of the Retina and Vitreous [facility]  I have reviewed the above documentation for accuracy and completeness, and I agree with the above. Gardiner Sleeper, M.D., Ph.D. 01/23/23 2:24 AM   Abbreviations: M myopia (nearsighted); A astigmatism; H hyperopia (farsighted); P presbyopia; Mrx spectacle prescription;  CTL contact lenses; OD right eye; OS left eye; OU both eyes  XT exotropia; ET esotropia; PEK punctate epithelial keratitis; PEE punctate epithelial erosions; DES dry eye syndrome; MGD meibomian gland dysfunction; ATs artificial tears; PFAT's preservative free artificial tears; [place] nuclear sclerotic cataract; PSC posterior subcapsular cataract; ERM epi-retinal membrane; PVD posterior vitreous detachment; RD retinal detachment; DM diabetes mellitus; DR diabetic retinopathy; NPDR non-proliferative diabetic retinopathy; PDR proliferative diabetic retinopathy; CSME clinically significant macular edema; DME diabetic macular edema; dbh dot blot hemorrhages; CWS cotton wool spot; POAG primary open angle glaucoma; C/D cup-to-disc ratio; HVF humphrey visual field; GVF goldmann visual field; OCT optical coherence tomography; IOP intraocular pressure; BRVO Branch retinal vein occlusion; CRVO central retinal vein occlusion; CRAO central retinal  artery occlusion; BRAO branch retinal artery occlusion; RT retinal tear; SB scleral buckle; PPV pars plana vitrectomy; VH Vitreous hemorrhage; PRP panretinal laser photocoagulation; IVK intravitreal kenalog; VMT vitreomacular traction; MH Macular hole;  NVD neovascularization of the disc; NVE neovascularization elsewhere; AREDS age related eye disease study; ARMD age related macular degeneration; POAG primary open angle glaucoma; EBMD epithelial/anterior basement membrane dystrophy; ACIOL anterior chamber intraocular lens; IOL intraocular lens; PCIOL posterior chamber intraocular lens; Phaco/IOL phacoemulsification with intraocular lens placement; [place] photorefractive keratectomy; LASIK laser assisted in situ keratomileusis; HTN hypertension; DM diabetes mellitus; COPD chronic obstructive pulmonary disease

## 2023-01-21 ENCOUNTER — Ambulatory Visit (INDEPENDENT_AMBULATORY_CARE_PROVIDER_SITE_OTHER): Payer: PPO | Admitting: Ophthalmology

## 2023-01-21 DIAGNOSIS — H35033 Hypertensive retinopathy, bilateral: Secondary | ICD-10-CM

## 2023-01-21 DIAGNOSIS — H353221 Exudative age-related macular degeneration, left eye, with active choroidal neovascularization: Secondary | ICD-10-CM

## 2023-01-21 DIAGNOSIS — H353112 Nonexudative age-related macular degeneration, right eye, intermediate dry stage: Secondary | ICD-10-CM

## 2023-01-21 DIAGNOSIS — I10 Essential (primary) hypertension: Secondary | ICD-10-CM | POA: Diagnosis not present

## 2023-01-21 DIAGNOSIS — Z961 Presence of intraocular lens: Secondary | ICD-10-CM | POA: Diagnosis not present

## 2023-01-21 DIAGNOSIS — H353111 Nonexudative age-related macular degeneration, right eye, early dry stage: Secondary | ICD-10-CM

## 2023-01-23 ENCOUNTER — Encounter (INDEPENDENT_AMBULATORY_CARE_PROVIDER_SITE_OTHER): Payer: Self-pay | Admitting: Ophthalmology

## 2023-02-23 DIAGNOSIS — L120 Bullous pemphigoid: Secondary | ICD-10-CM | POA: Diagnosis not present

## 2023-03-02 ENCOUNTER — Other Ambulatory Visit (HOSPITAL_COMMUNITY): Payer: Self-pay

## 2023-03-04 DIAGNOSIS — Z961 Presence of intraocular lens: Secondary | ICD-10-CM | POA: Diagnosis not present

## 2023-03-04 DIAGNOSIS — I10 Essential (primary) hypertension: Secondary | ICD-10-CM | POA: Diagnosis not present

## 2023-03-04 DIAGNOSIS — H35033 Hypertensive retinopathy, bilateral: Secondary | ICD-10-CM | POA: Diagnosis not present

## 2023-03-04 DIAGNOSIS — H353112 Nonexudative age-related macular degeneration, right eye, intermediate dry stage: Secondary | ICD-10-CM | POA: Diagnosis not present

## 2023-03-04 DIAGNOSIS — H353221 Exudative age-related macular degeneration, left eye, with active choroidal neovascularization: Secondary | ICD-10-CM | POA: Diagnosis not present

## 2023-04-08 ENCOUNTER — Encounter (INDEPENDENT_AMBULATORY_CARE_PROVIDER_SITE_OTHER): Payer: PPO | Admitting: Ophthalmology

## 2023-04-08 DIAGNOSIS — H35033 Hypertensive retinopathy, bilateral: Secondary | ICD-10-CM

## 2023-04-08 DIAGNOSIS — H353221 Exudative age-related macular degeneration, left eye, with active choroidal neovascularization: Secondary | ICD-10-CM

## 2023-04-08 DIAGNOSIS — Z961 Presence of intraocular lens: Secondary | ICD-10-CM

## 2023-04-08 DIAGNOSIS — I10 Essential (primary) hypertension: Secondary | ICD-10-CM

## 2023-04-08 DIAGNOSIS — H353112 Nonexudative age-related macular degeneration, right eye, intermediate dry stage: Secondary | ICD-10-CM

## 2023-05-21 DIAGNOSIS — R7989 Other specified abnormal findings of blood chemistry: Secondary | ICD-10-CM | POA: Diagnosis not present

## 2023-05-21 DIAGNOSIS — R7303 Prediabetes: Secondary | ICD-10-CM | POA: Diagnosis not present

## 2023-05-21 DIAGNOSIS — I10 Essential (primary) hypertension: Secondary | ICD-10-CM | POA: Diagnosis not present

## 2023-05-21 DIAGNOSIS — M810 Age-related osteoporosis without current pathological fracture: Secondary | ICD-10-CM | POA: Diagnosis not present

## 2023-06-19 ENCOUNTER — Other Ambulatory Visit: Payer: Self-pay

## 2023-06-19 ENCOUNTER — Other Ambulatory Visit (HOSPITAL_COMMUNITY): Payer: Self-pay

## 2023-06-19 MED ORDER — AMLODIPINE BESYLATE 5 MG PO TABS
5.0000 mg | ORAL_TABLET | Freq: Every day | ORAL | 0 refills | Status: DC
  Filled 2023-06-19 (×2): qty 90, 90d supply, fill #0

## 2023-07-14 ENCOUNTER — Other Ambulatory Visit (HOSPITAL_COMMUNITY): Payer: Self-pay

## 2023-07-14 DIAGNOSIS — D561 Beta thalassemia: Secondary | ICD-10-CM | POA: Diagnosis not present

## 2023-07-14 DIAGNOSIS — N819 Female genital prolapse, unspecified: Secondary | ICD-10-CM | POA: Diagnosis not present

## 2023-07-14 DIAGNOSIS — Z1331 Encounter for screening for depression: Secondary | ICD-10-CM | POA: Diagnosis not present

## 2023-07-14 DIAGNOSIS — I10 Essential (primary) hypertension: Secondary | ICD-10-CM | POA: Diagnosis not present

## 2023-07-14 DIAGNOSIS — Z0000 Encounter for general adult medical examination without abnormal findings: Secondary | ICD-10-CM | POA: Diagnosis not present

## 2023-07-14 DIAGNOSIS — Z1339 Encounter for screening examination for other mental health and behavioral disorders: Secondary | ICD-10-CM | POA: Diagnosis not present

## 2023-07-14 DIAGNOSIS — L120 Bullous pemphigoid: Secondary | ICD-10-CM | POA: Diagnosis not present

## 2023-07-14 DIAGNOSIS — M810 Age-related osteoporosis without current pathological fracture: Secondary | ICD-10-CM | POA: Diagnosis not present

## 2023-07-14 MED ORDER — AMLODIPINE BESYLATE 5 MG PO TABS
5.0000 mg | ORAL_TABLET | Freq: Every day | ORAL | 3 refills | Status: DC
  Filled 2023-07-14 – 2023-10-09 (×2): qty 90, 90d supply, fill #0
  Filled 2024-02-01: qty 90, 90d supply, fill #1
  Filled 2024-05-17: qty 90, 90d supply, fill #2
  Filled 2024-05-20: qty 90, 90d supply, fill #0

## 2023-08-02 IMAGING — CT CT ABD-PELV W/ CM
2 of 5 series · 15 of 46 positions shown, 17 images · IV contrast (APPLIED)
Comparison: None.

CLINICAL DATA: Nausea, vomiting and diarrhea with abdominal pain
for several hours, initial encounter

EXAM:
CT ABDOMEN AND PELVIS WITH CONTRAST
TECHNIQUE: Multidetector CT imaging of the abdomen and pelvis was performed
using the standard protocol following bolus administration of
intravenous contrast.
CONTRAST:  75mL OMNIPAQUE IOHEXOL 300 MG/ML  SOLN

[Series 2: abd pel w · axial · 0.58mm/px · z∈[-967,-612]mm · 12 of 81 slices shown, 14 images]
[im 5/81  soft-tissue]
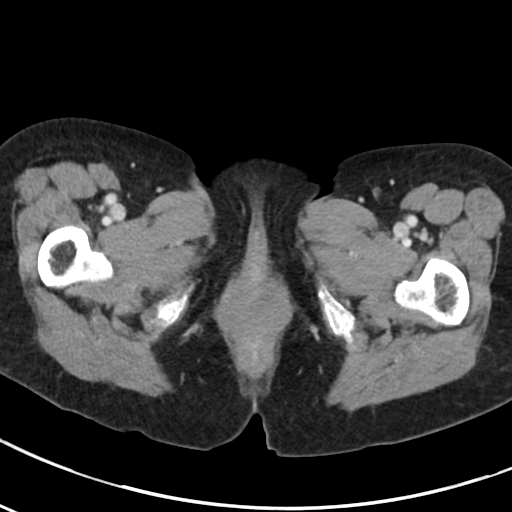
[im 5/81  bone]
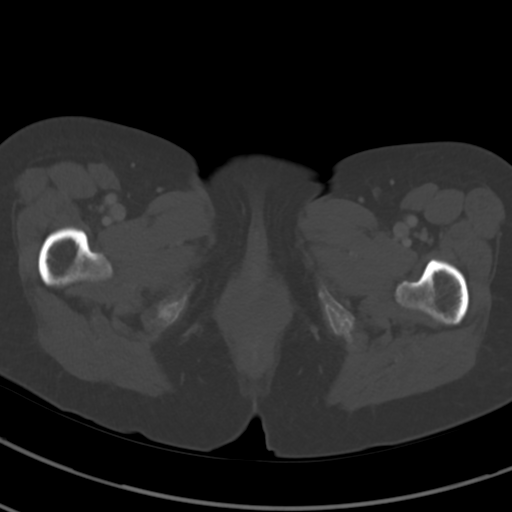
[im 13/81  soft-tissue]
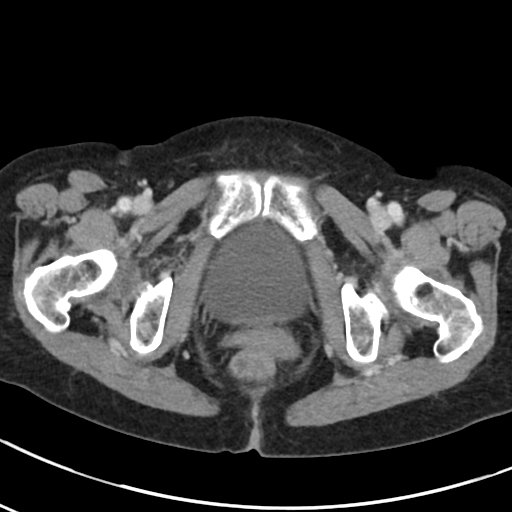
[im 17/81  soft-tissue]
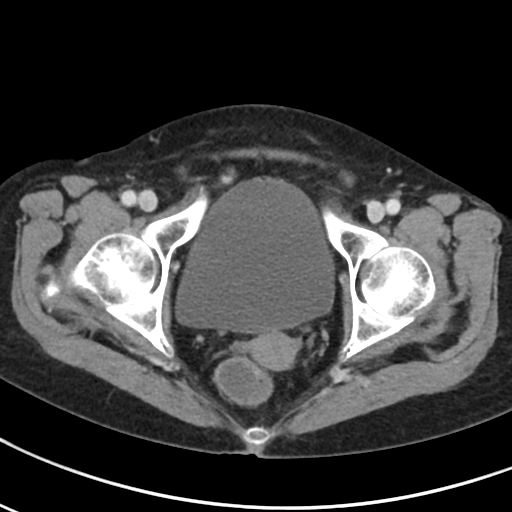
[im 26/81  soft-tissue]
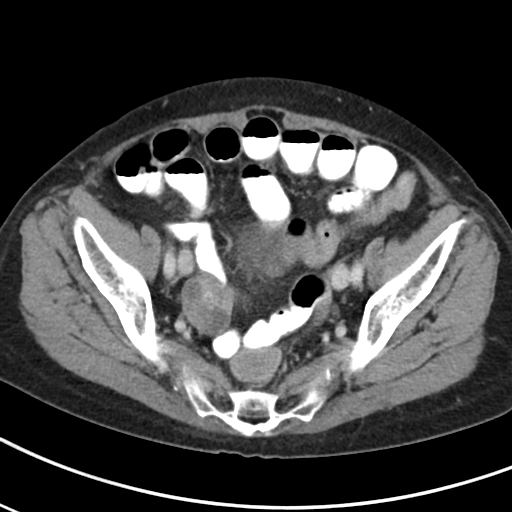
[im 30/81  soft-tissue]
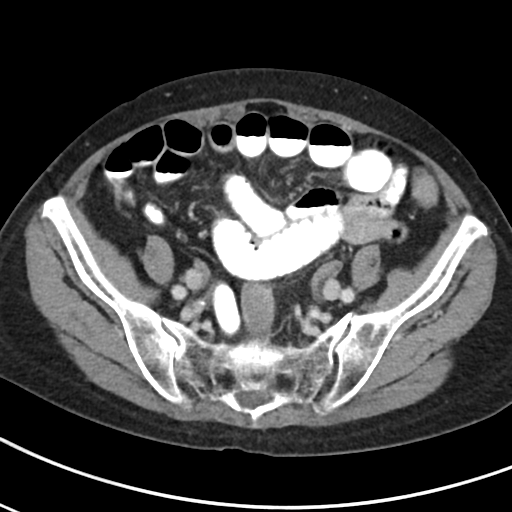
[im 38/81  soft-tissue]
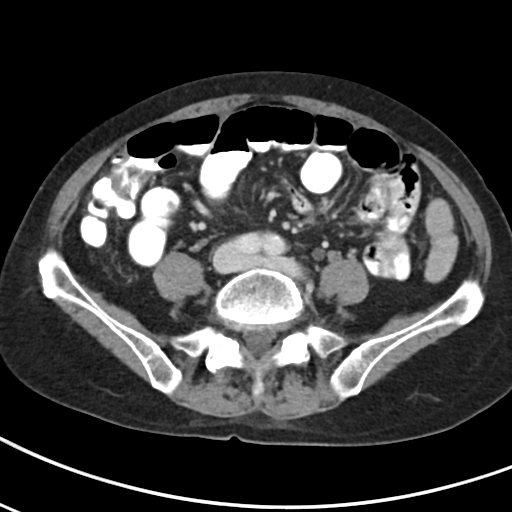
[im 43/81  soft-tissue]
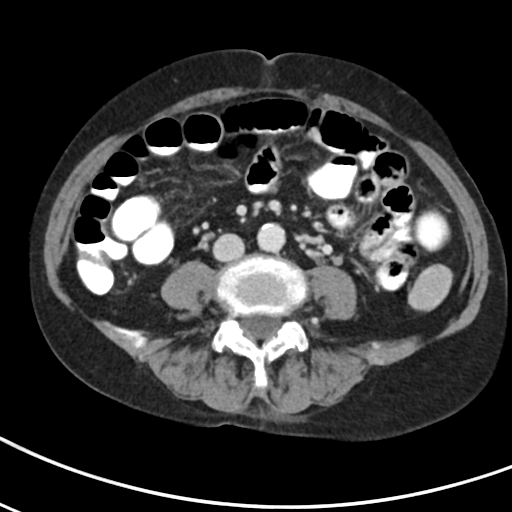
[im 51/81  soft-tissue]
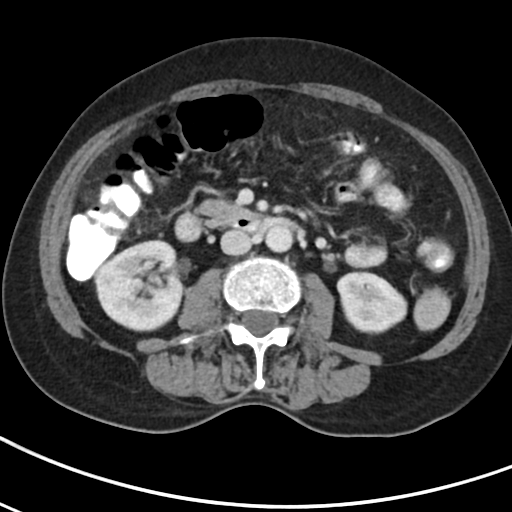
[im 55/81  soft-tissue]
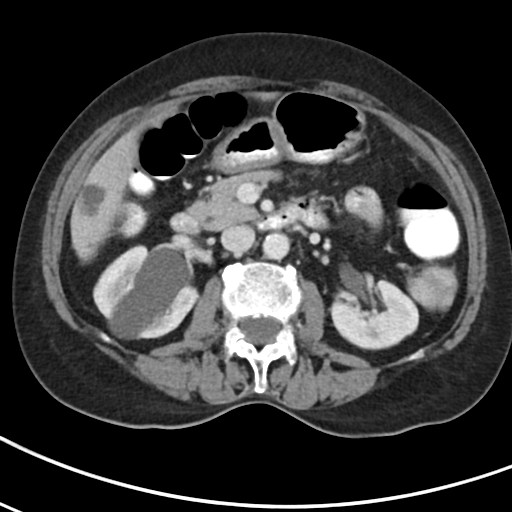
[im 55/81  bone]
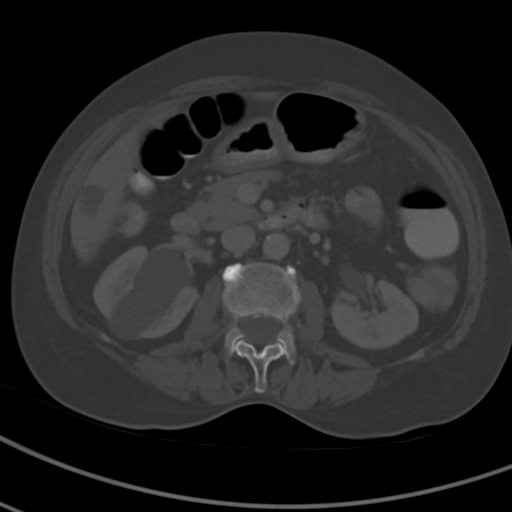
[im 64/81  soft-tissue]
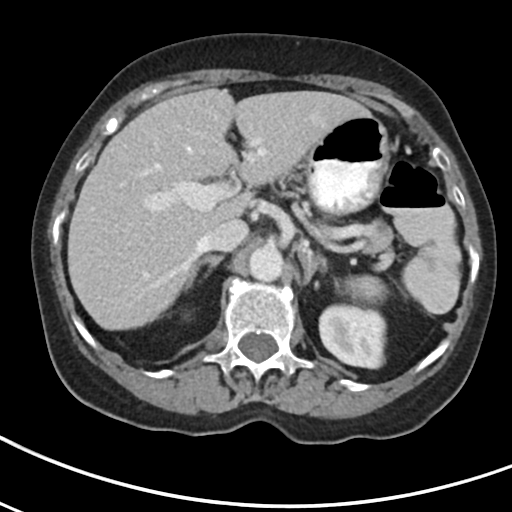
[im 68/81  soft-tissue]
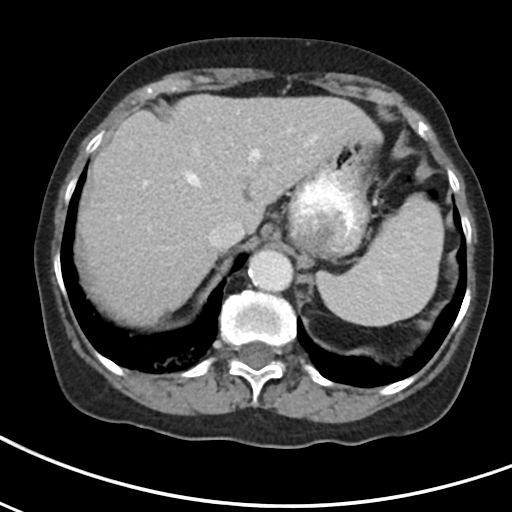
[im 76/81  soft-tissue]
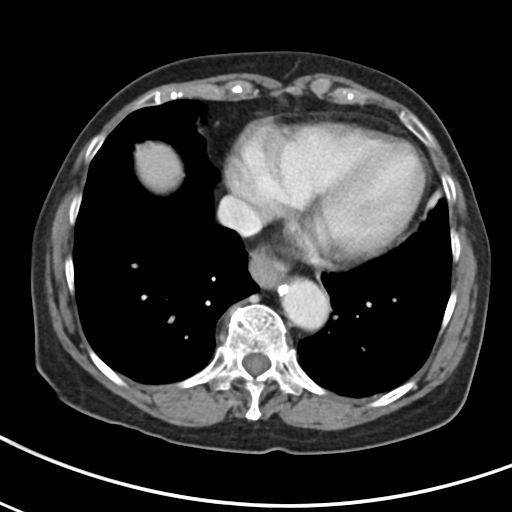

[Series 5: coronal · coronal · 0.57mm/px · 3 of 80 slices shown]
[im 27/80  soft-tissue]
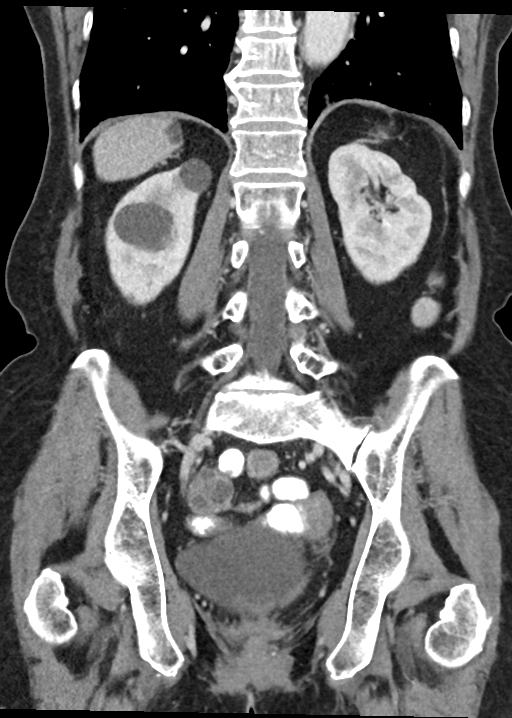
[im 36/80  soft-tissue]
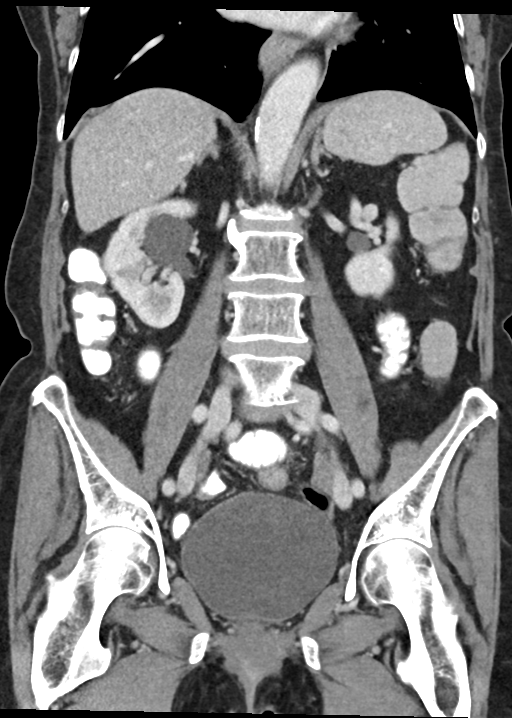
[im 44/80  soft-tissue]
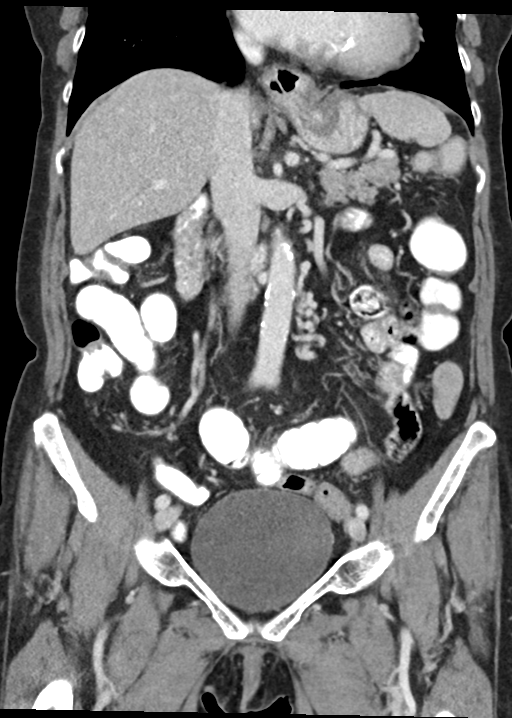

[15 of 46 positions shown; findings below may reference images not displayed]

FINDINGS: Lower chest: No acute abnormality.

Hepatobiliary: Liver shows a few scattered small cysts. The largest
of these is noted in the tip of the right lobe of the liver
measuring 2.0 cm in greatest dimension. Gallbladder is within normal
limits.

Pancreas: Unremarkable. No pancreatic ductal dilatation or
surrounding inflammatory changes.

Spleen: Normal in size without focal abnormality.

Adrenals/Urinary Tract: Adrenal glands are within normal limits.
Kidneys are well visualized bilaterally. Renal cysts are noted on
the right. Normal excretion is noted on delayed images. Bladder is
well distended.

Stomach/Bowel: The appendix is not well visualized. No inflammatory
changes to suggest appendicitis are noted. Mild small-bowel
dilatation is noted without definitive obstructive change. This may
represent a degree of enteritis. Stomach is within normal limits.

Vascular/Lymphatic: Aortic atherosclerosis. No enlarged abdominal or
pelvic lymph nodes.

Reproductive: Uterus is within normal limits. The left ovary is not
well appreciated. Right ovary is mildly prominent measuring 3.7 cm
in greatest dimension. Scattered small cysts as well as a few
calcifications are noted within.

Other: No abdominal wall hernia or abnormality. No abdominopelvic
ascites.

Musculoskeletal: No acute or significant osseous findings.
IMPRESSION: Hepatic and renal cysts.

Mild small bowel dilatation without true obstruction likely
representing a degree of enteritis.

Mildly prominent right ovary measuring 3.7 cm with multiple smaller
cysts within. Recommend follow-up US in 6 months.

## 2023-08-06 DIAGNOSIS — H52203 Unspecified astigmatism, bilateral: Secondary | ICD-10-CM | POA: Diagnosis not present

## 2023-08-06 DIAGNOSIS — H353221 Exudative age-related macular degeneration, left eye, with active choroidal neovascularization: Secondary | ICD-10-CM | POA: Diagnosis not present

## 2023-08-06 DIAGNOSIS — Z961 Presence of intraocular lens: Secondary | ICD-10-CM | POA: Diagnosis not present

## 2023-08-06 DIAGNOSIS — H353112 Nonexudative age-related macular degeneration, right eye, intermediate dry stage: Secondary | ICD-10-CM | POA: Diagnosis not present

## 2023-09-24 IMAGING — CR DG SHOULDER 2+V*R*
3 series · 3 of 3 positions shown · non-contrast
Comparison: None.

CLINICAL DATA: Right shoulder pain.

EXAM:
RIGHT SHOULDER - 2+ VIEW

[w shoulder grashey right]
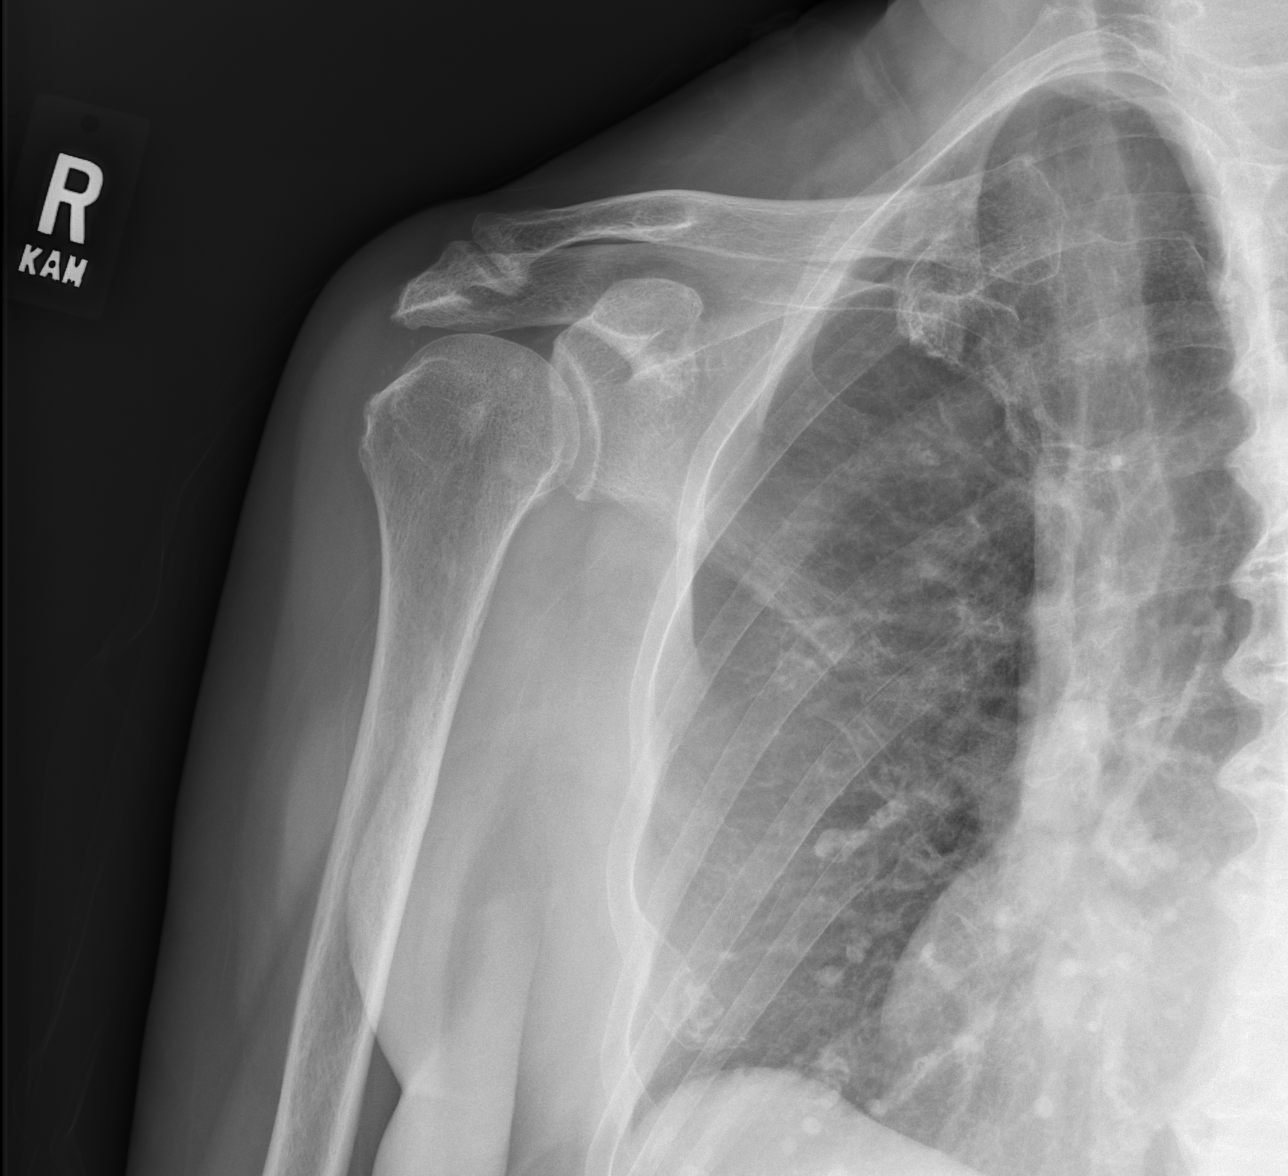

[w shoulder y-view right]
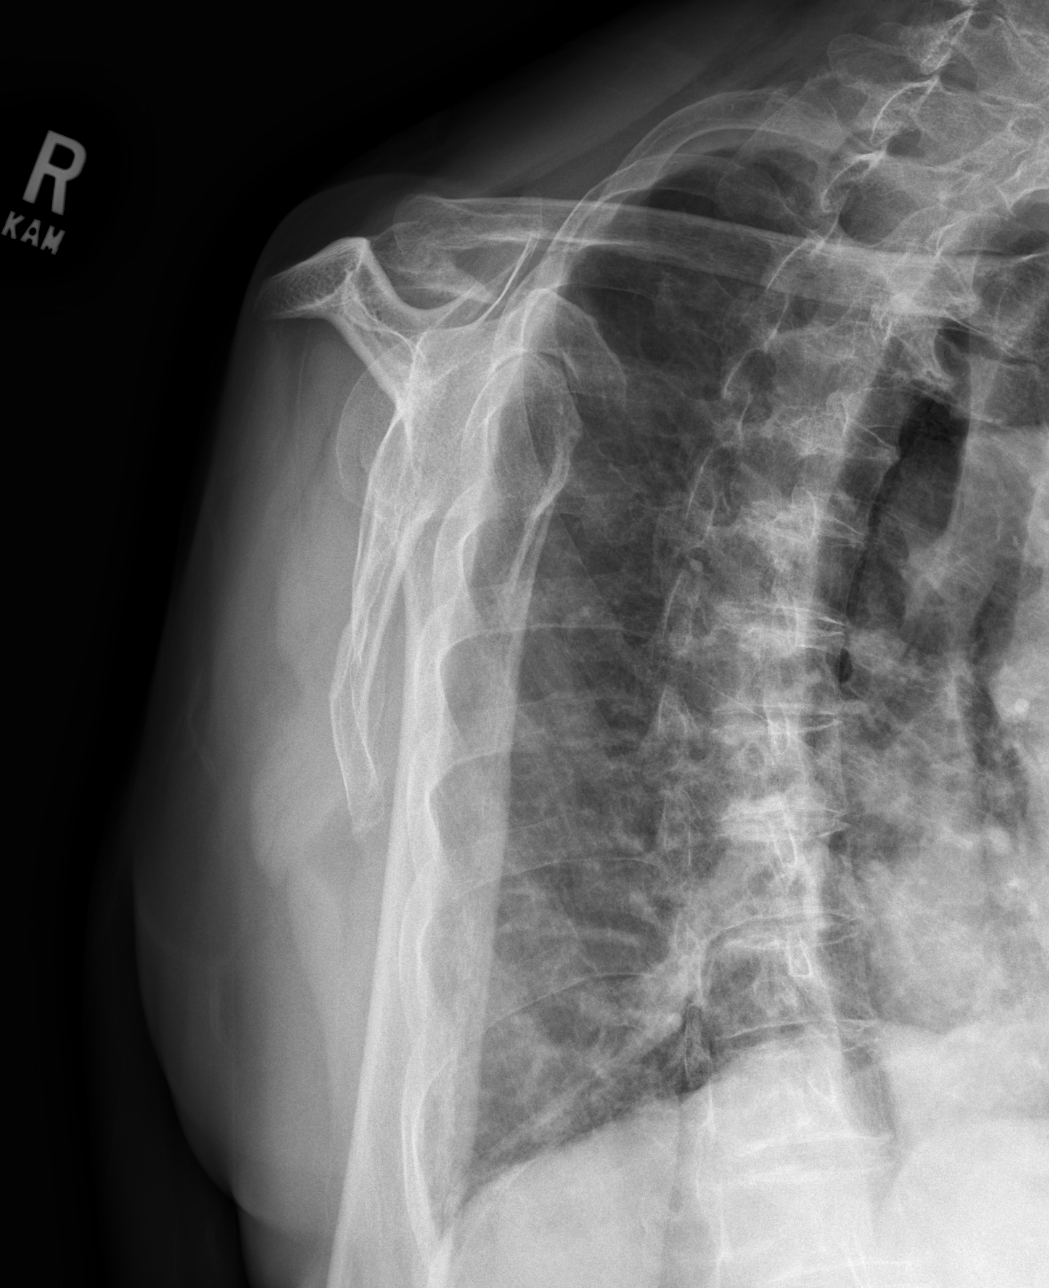

[w shoulder axillary right]
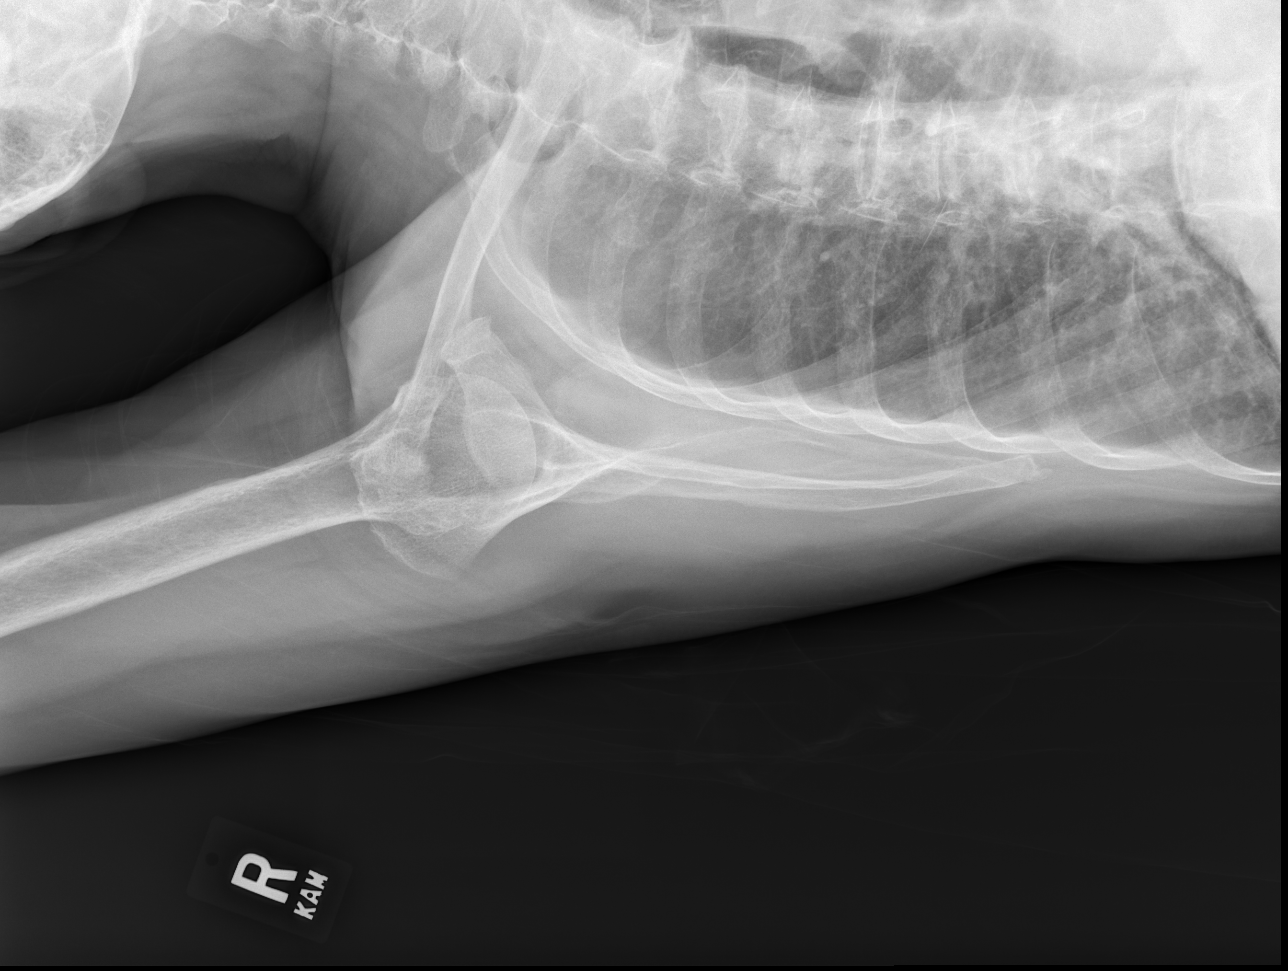

[3 of 3 positions shown; findings below may reference images not displayed]

FINDINGS: There is no evidence for acute fracture or dislocation. No focal
osseous lesions are identified. There is mild degenerative change of
the acromioclavicular joint with osteophyte formation. Joint spaces
are maintained. There is some calcifications superior to the greater
tuberosity likely related to calcific tendinitis.
IMPRESSION: 1. No acute bony abnormality.
2. Mild degenerative changes of the right shoulder.

## 2023-10-09 ENCOUNTER — Other Ambulatory Visit (HOSPITAL_COMMUNITY): Payer: Self-pay

## 2023-10-15 ENCOUNTER — Other Ambulatory Visit (HOSPITAL_COMMUNITY): Payer: Self-pay

## 2023-10-16 ENCOUNTER — Ambulatory Visit (HOSPITAL_COMMUNITY)
Admission: RE | Admit: 2023-10-16 | Discharge: 2023-10-16 | Disposition: A | Discharge status: Home / Self Care | Payer: PPO | Source: Ambulatory Visit | Attending: Internal Medicine | Admitting: Internal Medicine

## 2023-10-16 DIAGNOSIS — M810 Age-related osteoporosis without current pathological fracture: Secondary | ICD-10-CM | POA: Diagnosis not present

## 2023-10-16 MED ORDER — DENOSUMAB 60 MG/ML ~~LOC~~ SOSY
60.0000 mg | PREFILLED_SYRINGE | Freq: Once | SUBCUTANEOUS | Status: AC
  Administered 2023-10-16: 60 mg via SUBCUTANEOUS

## 2023-10-16 MED ORDER — DENOSUMAB 60 MG/ML ~~LOC~~ SOSY
PREFILLED_SYRINGE | SUBCUTANEOUS | Status: AC
  Filled 2023-10-16: qty 1

## 2024-02-01 ENCOUNTER — Other Ambulatory Visit (HOSPITAL_COMMUNITY): Payer: Self-pay

## 2024-03-18 ENCOUNTER — Other Ambulatory Visit (HOSPITAL_COMMUNITY): Payer: Self-pay

## 2024-04-15 ENCOUNTER — Telehealth: Payer: Self-pay | Admitting: Pharmacy Technician

## 2024-04-15 NOTE — Telephone Encounter (Signed)
 Auth Submission: NO AUTH NEEDED Site of care: Site of care: [facility] Payer: [employer]t Medication & CPT/J Code(s) submitted: Prolia  (Denosumab ) [id number] Diagnosis Code:  Route of submission (phone, fax, portal):  Phone # Fax # Auth type: Buy/Bill HB Units/visits requested: 60mg  q74months x2 doses Reference number:  Approval from: 04/15/24 to 11/02/24

## 2024-05-17 ENCOUNTER — Other Ambulatory Visit (HOSPITAL_COMMUNITY): Payer: Self-pay

## 2024-05-20 ENCOUNTER — Other Ambulatory Visit (HOSPITAL_COMMUNITY): Payer: Self-pay

## 2024-07-12 ENCOUNTER — Other Ambulatory Visit (HOSPITAL_COMMUNITY): Payer: Self-pay | Admitting: Internal Medicine

## 2024-08-22 NOTE — Telephone Encounter (Signed)
 Per GMA RN, [place], CMET completed on 06/29/24 and calcium was 9.2.  Sheyla Zaffino, CPhT [facility] Phone: [phone] 08/22/2024

## 2024-08-23 ENCOUNTER — Other Ambulatory Visit: Payer: Self-pay

## 2024-08-23 ENCOUNTER — Ambulatory Visit (HOSPITAL_COMMUNITY)
Admission: RE | Admit: 2024-08-23 | Discharge: 2024-08-23 | Disposition: A | Discharge status: Home / Self Care | Source: Ambulatory Visit | Attending: Internal Medicine | Admitting: Internal Medicine

## 2024-08-23 ENCOUNTER — Other Ambulatory Visit (HOSPITAL_COMMUNITY): Payer: Self-pay

## 2024-08-23 VITALS — BP 163/70 | HR 77 | Temp 97.5°F | Resp 18

## 2024-08-23 DIAGNOSIS — M810 Age-related osteoporosis without current pathological fracture: Secondary | ICD-10-CM | POA: Diagnosis not present

## 2024-08-23 DIAGNOSIS — Z961 Presence of intraocular lens: Secondary | ICD-10-CM | POA: Diagnosis not present

## 2024-08-23 DIAGNOSIS — H353112 Nonexudative age-related macular degeneration, right eye, intermediate dry stage: Secondary | ICD-10-CM | POA: Diagnosis not present

## 2024-08-23 DIAGNOSIS — H52203 Unspecified astigmatism, bilateral: Secondary | ICD-10-CM | POA: Diagnosis not present

## 2024-08-23 MED ORDER — DENOSUMAB 60 MG/ML ~~LOC~~ SOSY
60.0000 mg | PREFILLED_SYRINGE | Freq: Once | SUBCUTANEOUS | Status: AC
  Administered 2024-08-23: 60 mg via SUBCUTANEOUS

## 2024-08-23 MED ORDER — DENOSUMAB 60 MG/ML ~~LOC~~ SOSY
PREFILLED_SYRINGE | SUBCUTANEOUS | Status: AC
  Filled 2024-08-23: qty 1

## 2024-08-23 MED ORDER — AMLODIPINE BESYLATE 5 MG PO TABS
5.0000 mg | ORAL_TABLET | Freq: Every day | ORAL | 3 refills | Status: AC
  Filled 2024-08-23: qty 90, 90d supply, fill #0

## 2024-09-01 DIAGNOSIS — H6122 Impacted cerumen, left ear: Secondary | ICD-10-CM | POA: Diagnosis not present

## 2024-09-01 DIAGNOSIS — H6121 Impacted cerumen, right ear: Secondary | ICD-10-CM | POA: Diagnosis not present

## 2024-09-13 DIAGNOSIS — H353112 Nonexudative age-related macular degeneration, right eye, intermediate dry stage: Secondary | ICD-10-CM | POA: Diagnosis not present

## 2024-09-13 DIAGNOSIS — H353124 Nonexudative age-related macular degeneration, left eye, advanced atrophic with subfoveal involvement: Secondary | ICD-10-CM | POA: Diagnosis not present

## 2024-09-23 ENCOUNTER — Other Ambulatory Visit (HOSPITAL_COMMUNITY): Payer: Self-pay

## 2024-09-23 MED ORDER — FLUZONE HIGH-DOSE 0.5 ML IM SUSY
0.5000 mL | PREFILLED_SYRINGE | Freq: Once | INTRAMUSCULAR | 0 refills | Status: AC
  Filled 2024-09-23: qty 0.5, 1d supply, fill #0

## 2025-02-22 ENCOUNTER — Encounter (HOSPITAL_COMMUNITY)
